# Patient Record
Sex: Female | Born: 1945 | Race: White | Hispanic: No | Smoking: Former smoker
Health system: Southern US, Community
[De-identification: ages and names within clinical notes are randomized; demographics above are authoritative.]

## PROBLEM LIST (undated history)

## (undated) DIAGNOSIS — L02619 Cutaneous abscess of unspecified foot: Secondary | ICD-10-CM

## (undated) DIAGNOSIS — L03039 Cellulitis of unspecified toe: Secondary | ICD-10-CM

## (undated) DIAGNOSIS — N200 Calculus of kidney: Secondary | ICD-10-CM

## (undated) HISTORY — PX: HEMORRHOID SURGERY: SHX153

## (undated) HISTORY — PX: TUBAL LIGATION: SHX77

## (undated) HISTORY — PX: CHOLECYSTECTOMY: SHX55

## (undated) HISTORY — PX: APPENDECTOMY: SHX54

---

## 2017-10-31 ENCOUNTER — Emergency Department (HOSPITAL_COMMUNITY)
Admission: EM | Admit: 2017-10-31 | Discharge: 2017-10-31 | Discharge status: Against Medical Advice | Payer: Self-pay | Attending: Emergency Medicine | Admitting: Emergency Medicine

## 2017-10-31 ENCOUNTER — Encounter (HOSPITAL_COMMUNITY): Payer: Self-pay | Admitting: *Deleted

## 2017-10-31 DIAGNOSIS — F17210 Nicotine dependence, cigarettes, uncomplicated: Secondary | ICD-10-CM | POA: Insufficient documentation

## 2017-10-31 DIAGNOSIS — Z87442 Personal history of urinary calculi: Secondary | ICD-10-CM | POA: Insufficient documentation

## 2017-10-31 DIAGNOSIS — Z5321 Procedure and treatment not carried out due to patient leaving prior to being seen by health care provider: Secondary | ICD-10-CM | POA: Insufficient documentation

## 2017-10-31 DIAGNOSIS — R109 Unspecified abdominal pain: Secondary | ICD-10-CM | POA: Insufficient documentation

## 2017-10-31 HISTORY — DX: Calculus of kidney: N20.0

## 2017-10-31 LAB — BASIC METABOLIC PANEL
ANION GAP: 10 (ref 5–15)
BUN: 11 mg/dL (ref 8–23)
CALCIUM: 9.8 mg/dL (ref 8.9–10.3)
CO2: 28 mmol/L (ref 22–32)
Chloride: 103 mmol/L (ref 98–111)
Creatinine, Ser: 0.76 mg/dL (ref 0.44–1.00)
GFR calc Af Amer: >60 mL/min (ref >60)
Glucose, Bld: 78 mg/dL (ref 70–99)
POTASSIUM: 4.6 mmol/L (ref 3.5–5.1)
SODIUM: 141 mmol/L (ref 135–145)

## 2017-10-31 LAB — URINALYSIS, ROUTINE W REFLEX MICROSCOPIC
Bilirubin Urine: NEGATIVE
GLUCOSE, UA: NEGATIVE mg/dL
Ketones, ur: NEGATIVE mg/dL
Nitrite: NEGATIVE
PH: 7 (ref 5.0–8.0)
Protein, ur: NEGATIVE mg/dL

## 2017-10-31 LAB — URINALYSIS, MICROSCOPIC (REFLEX)

## 2017-10-31 LAB — CBC WITH DIFFERENTIAL/PLATELET
Abs Immature Granulocytes: 0 10*3/uL (ref 0.0–0.1)
BASOS ABS: 0.1 10*3/uL (ref 0.0–0.1)
BASOS PCT: 1 %
Eosinophils Absolute: 0.1 10*3/uL (ref 0.0–0.7)
Eosinophils Relative: 1 %
HCT: 46.3 % — ABNORMAL HIGH (ref 36.0–46.0)
HEMOGLOBIN: 14.2 g/dL (ref 12.0–15.0)
Immature Granulocytes: 0 %
LYMPHS PCT: 30 %
Lymphs Abs: 2.5 10*3/uL (ref 0.7–4.0)
MCH: 29.2 pg (ref 26.0–34.0)
MCHC: 30.7 g/dL (ref 30.0–36.0)
MCV: 95.3 fL (ref 78.0–100.0)
Monocytes Absolute: 0.7 10*3/uL (ref 0.1–1.0)
Monocytes Relative: 8 %
NEUTROS ABS: 5 10*3/uL (ref 1.7–7.7)
Neutrophils Relative %: 60 %
PLATELETS: 249 10*3/uL (ref 150–400)
RBC: 4.86 MIL/uL (ref 3.87–5.11)
RDW: 13.2 % (ref 11.5–15.5)
WBC: 8.4 10*3/uL (ref 4.0–10.5)

## 2017-10-31 MED ORDER — SODIUM CHLORIDE 0.9 % IV BOLUS: 500.0000 mL | Freq: Once | INTRAVENOUS | Status: DC

## 2017-10-31 MED ORDER — KETOROLAC TROMETHAMINE 15 MG/ML IJ SOLN: 15.0000 mg | Freq: Once | INTRAMUSCULAR | Status: DC

## 2017-10-31 NOTE — ED Notes (Signed)
Pt requesting to leave. Apolinar JunesBrandon, [place], at the bedside discussing risks of leaving AMA. Pt voiced understanding.

## 2017-10-31 NOTE — ED Provider Notes (Signed)
[facility] EMERGENCY DEPARTMENT Provider Note   CSN: [id number] Arrival date & time: 10/31/17  1101     History   Chief Complaint Chief Complaint  Patient presents with  . Flank Pain    HPI Kristin Morales is a 72 y.o. female with self-reported history of kidney stones presenting for right flank pain today.  Patient states that her pain began on Saturday, describes it as a throbbing constant pain that has been gradually improving over the past 5 days.  Patient states for the first 4 days her pain was 10/10 in severity and she did not seek medical care.  Patient states that upon arrival to the emergency department her pain was 8/10 in severity.  Finally after providing urine sample patient states that her pain has greatly decreased to 6/10 in severity.  At time of my evaluation patient states that she does not wish to have blood work or imaging done, she is requesting pain medicine to "take the edge off ".  Patient denies history of fever, hematuria, dysuria, recent injury/fall.  Patient states that she feels well aside from moderate right flank pain at this time.  Patient denies abdominal pain or chest pain or headache.  I discussed benefits of blood work and imaging at length the patient, she still is refusing only requesting pain medicine at this time.  HPI  Past Medical History:  Diagnosis Date  . Kidney stones     There are no active problems to display for this patient.   Past Surgical History:  Procedure Laterality Date  . APPENDECTOMY    . CHOLECYSTECTOMY    . HEMORRHOID SURGERY    . TUBAL LIGATION       OB History   None      Home Medications    Prior to Admission medications   Not on File    Family History No family history on file.  Social History Social History   Tobacco Use  . Smoking status: Current Every Day Smoker  . Smokeless tobacco: Never Used  Substance Use Topics  . Alcohol use: Never    Frequency: Never  . Drug  use: Never     Allergies   Patient has no known allergies.   Review of Systems Review of Systems  Constitutional: Negative.  Negative for chills, fatigue and fever.  HENT: Negative.  Negative for rhinorrhea and sore throat.   Eyes: Negative.  Negative for visual disturbance.  Respiratory: Negative.  Negative for cough and shortness of breath.   Cardiovascular: Negative.  Negative for chest pain.  Gastrointestinal: Negative.  Negative for abdominal pain, blood in stool, diarrhea, nausea and vomiting.  Genitourinary: Positive for flank pain. Negative for dysuria and hematuria.  Musculoskeletal: Negative for arthralgias and myalgias.  Skin: Negative.  Negative for rash.  Neurological: Negative.  Negative for dizziness, weakness and headaches.     Physical Exam Updated Vital Signs BP 123/90 (BP Location: Right Arm)   Pulse 87   Temp 98.8 F (37.1 C) (Oral)   Resp 18   Ht 5\' 6"  (1.676 m)   Wt 70.3 kg   SpO2 98%   BMI 25.02 kg/m   Physical Exam  Constitutional: She is oriented to person, place, and time. She appears well-developed and well-nourished. No distress.  HENT:  Head: Normocephalic and atraumatic.  Right Ear: External ear normal.  Left Ear: External ear normal.  Nose: Nose normal.  Eyes: Pupils are equal, round, and reactive to light. EOM are normal.  Neck: Trachea normal and normal range of motion. No tracheal deviation present.  Cardiovascular: Normal rate, regular rhythm, normal heart sounds and intact distal pulses.  Pulses:      Dorsalis pedis pulses are 2+ on the right side, and 2+ on the left side.       Posterior tibial pulses are 2+ on the right side, and 2+ on the left side.  Pulmonary/Chest: Effort normal and breath sounds normal. No respiratory distress.  Abdominal: Soft. Bowel sounds are normal. There is no tenderness. There is CVA tenderness. There is no rigidity, no rebound, no guarding, no tenderness at McBurney's point and negative Murphy's sign.    Multiple well-healed surgical scars present.  Musculoskeletal: Normal range of motion.       Cervical back: Normal.       Thoracic back: Normal.       Lumbar back: Normal.       Back:  Area of patient's flank pain indicated on chart.  Neurological: She is alert and oriented to person, place, and time. GCS eye subscore is 4. GCS verbal subscore is 5. GCS motor subscore is 6.  Speech is clear and goal oriented, follows commands Major Cranial nerves without deficit, no facial droop Moves extremities without ataxia, coordination intact Normal gait  Skin: Skin is warm and dry.  Psychiatric: She has a normal mood and affect. Her behavior is normal.   ED Treatments / Results  Labs (all labs ordered are listed, but only abnormal results are displayed) Labs Reviewed  URINALYSIS, ROUTINE W REFLEX MICROSCOPIC - Abnormal; Notable for the following components:      Result Value   Specific Gravity, Urine <1.005 (*)    Hgb urine dipstick TRACE (*)    Leukocytes, UA TRACE (*)    All other components within normal limits  URINALYSIS, MICROSCOPIC (REFLEX) - Abnormal; Notable for the following components:   Bacteria, UA RARE (*)    All other components within normal limits  BASIC METABOLIC PANEL  CBC WITH DIFFERENTIAL/PLATELET    EKG None  Radiology No results found.  Procedures Procedures (including critical care time)  Medications Ordered in ED Medications  ketorolac (TORADOL) 15 MG/ML injection 15 mg (has no administration in time range)  sodium chloride 0.9 % bolus 500 mL (has no administration in time range)     Initial Impression / Assessment and Plan / ED Course  I have reviewed the triage vital signs and the nursing notes.  Pertinent labs & imaging results that were available during my care of the patient were reviewed by me and considered in my medical decision making (see chart for details).  Clinical Course as of Oct 31 1320  Wed Oct 31, 2017  1310 Patient attempting  to leave department.  States that she is feeling better, does not wish to receive full work-up today including blood work or imaging.   [BM]  1317 I have discussed the risks of leaving AGAINST MEDICAL ADVICE at length with the patient including increase in pain, infection, worsening of symptoms, disability and death.  Patient states understanding of risks and still wishes to leave AGAINST MEDICAL ADVICE at this time.  Patient is alert and oriented x3, full mental capacity to make her own medical decisions.  Patient has been encouraged to return to the emergency department at any time for further evaluation and treatment.  Patient has left at this time AGAINST MEDICAL ADVICE.   [BM]    Clinical Course User Index [BM] Bill Salinas,  PA-C   Blood work, fluids, imaging not performed today per patient. Urinalysis shows trace hemoglobin and trace of leukocytes.  Patient informed of results and still refuses further work-up.  I have discussed the risks of leaving AGAINST MEDICAL ADVICE at length with the patient including pain, infection, worsening of symptoms, disability and death.  Patient states understanding of risks and still wishes to leave AGAINST MEDICAL ADVICE at this time.  Patient is alert and oriented x3 and has full mental capacity and the ability to make her own medical decisions.  Patient has been encouraged to return to the emergency department at any time for further evaluation and treatment.  Patient states understanding.  Patient has left at this time AGAINST MEDICAL ADVICE.  Note: Portions of this report may have been transcribed using voice recognition software. Every effort was made to ensure accuracy; however, inadvertent computerized transcription errors may still be present.  Final Clinical Impressions(s) / ED Diagnoses   Final diagnoses:  Flank pain    ED Discharge Orders    None       Elizabeth Palau 10/31/17 1324    Mancel Bale, MD 11/06/17  [phone]

## 2017-10-31 NOTE — ED Triage Notes (Signed)
Pt with hx of kidney stones which occur about once or twice a year is here for right flank pain that has been going on since Saturday.  Pt denies any urinary symptoms.  Pt is alert and oriented.  8/10 pain

## 2017-11-01 ENCOUNTER — Other Ambulatory Visit: Payer: Self-pay

## 2017-11-01 ENCOUNTER — Encounter (HOSPITAL_COMMUNITY): Payer: Self-pay | Admitting: Emergency Medicine

## 2017-11-01 ENCOUNTER — Emergency Department (HOSPITAL_COMMUNITY)
Admission: EM | Admit: 2017-11-01 | Discharge: 2017-11-02 | Disposition: A | Discharge status: Home / Self Care | Payer: Self-pay | Attending: Emergency Medicine | Admitting: Emergency Medicine

## 2017-11-01 DIAGNOSIS — F17200 Nicotine dependence, unspecified, uncomplicated: Secondary | ICD-10-CM | POA: Insufficient documentation

## 2017-11-01 DIAGNOSIS — M549 Dorsalgia, unspecified: Secondary | ICD-10-CM | POA: Insufficient documentation

## 2017-11-01 DIAGNOSIS — N390 Urinary tract infection, site not specified: Secondary | ICD-10-CM | POA: Insufficient documentation

## 2017-11-01 LAB — BASIC METABOLIC PANEL
ANION GAP: 8 (ref 5–15)
BUN: 13 mg/dL (ref 8–23)
CALCIUM: 9.1 mg/dL (ref 8.9–10.3)
CO2: 27 mmol/L (ref 22–32)
Chloride: 104 mmol/L (ref 98–111)
Creatinine, Ser: 0.78 mg/dL (ref 0.44–1.00)
Glucose, Bld: 90 mg/dL (ref 70–99)
POTASSIUM: 4 mmol/L (ref 3.5–5.1)
SODIUM: 139 mmol/L (ref 135–145)

## 2017-11-01 LAB — CBC
HCT: 43.6 % (ref 36.0–46.0)
Hemoglobin: 13.5 g/dL (ref 12.0–15.0)
MCH: 29.7 pg (ref 26.0–34.0)
MCHC: 31 g/dL (ref 30.0–36.0)
MCV: 96 fL (ref 78.0–100.0)
Platelets: 242 10*3/uL (ref 150–400)
RBC: 4.54 MIL/uL (ref 3.87–5.11)
RDW: 13.4 % (ref 11.5–15.5)
WBC: 8.5 10*3/uL (ref 4.0–10.5)

## 2017-11-01 LAB — URINALYSIS, ROUTINE W REFLEX MICROSCOPIC
Bilirubin Urine: NEGATIVE
Glucose, UA: NEGATIVE mg/dL
KETONES UR: NEGATIVE mg/dL
Nitrite: NEGATIVE
PROTEIN: NEGATIVE mg/dL
Specific Gravity, Urine: 1.013 (ref 1.005–1.030)
pH: 5 (ref 5.0–8.0)

## 2017-11-01 NOTE — ED Notes (Signed)
Pt called to come back to triage. No response.

## 2017-11-01 NOTE — ED Triage Notes (Signed)
Pt presents with continued R flank pain and dysuria and dribbling; pt states she was here the other day for same and left d/t wait and didn't have insurance and wasn't sure if she could afford a CT renal scan but found out she has a charity that can help

## 2017-11-02 ENCOUNTER — Emergency Department (HOSPITAL_COMMUNITY): Payer: Self-pay

## 2017-11-02 MED ORDER — ONDANSETRON HCL 4 MG/2ML IJ SOLN
4.0000 mg | Freq: Once | INTRAMUSCULAR | Status: AC
  Administered 2017-11-02: 4 mg via INTRAVENOUS
  Filled 2017-11-02: qty 2

## 2017-11-02 MED ORDER — FENTANYL CITRATE (PF) 100 MCG/2ML IJ SOLN
50.0000 ug | Freq: Once | INTRAMUSCULAR | Status: AC
  Administered 2017-11-02: 50 ug via INTRAVENOUS
  Filled 2017-11-02: qty 2

## 2017-11-02 MED ORDER — SODIUM CHLORIDE 0.9 % IV SOLN
1.0000 g | Freq: Once | INTRAVENOUS | Status: AC
  Administered 2017-11-02: 1 g via INTRAVENOUS
  Filled 2017-11-02: qty 10

## 2017-11-02 MED ORDER — CEPHALEXIN 500 MG PO CAPS: 500.0000 mg | ORAL_CAPSULE | Freq: Two times a day (BID) | ORAL | 0 refills | Status: DC

## 2017-11-02 MED ORDER — HYDROCODONE-ACETAMINOPHEN 5-325 MG PO TABS: 1.0000 | ORAL_TABLET | ORAL | 0 refills | Status: DC | PRN

## 2017-11-02 NOTE — ED Provider Notes (Signed)
TIME SEEN: 3:35 AM  CHIEF COMPLAINT: Right flank pain  HPI: Patient is a 72 year old female with history of kidney stones who presents to the emergency department with right flank pain.  Having some difficulty with urinating, urinary frequency and urgency but no dysuria or hematuria.  Denies nausea, vomiting or diarrhea.  No injury to her back.  No abdominal pain.  Was seen here earlier today and it was recommended that she have lab work, CT imaging but she left AGAINST MEDICAL ADVICE.  States she is back because she has found a way to pay for her visit.  ROS: See HPI Constitutional: no fever  Eyes: no drainage  ENT: no runny nose   Cardiovascular:  no chest pain  Resp: no SOB  GI: no vomiting GU: no dysuria Integumentary: no rash  Allergy: no hives  Musculoskeletal: no leg swelling  Neurological: no slurred speech ROS otherwise negative  PAST MEDICAL HISTORY/PAST SURGICAL HISTORY:  Past Medical History:  Diagnosis Date  . Kidney stones     MEDICATIONS:  Prior to Admission medications   Not on File    ALLERGIES:  No Known Allergies  SOCIAL HISTORY:  Social History   Tobacco Use  . Smoking status: Current Every Day Smoker  . Smokeless tobacco: Never Used  Substance Use Topics  . Alcohol use: Never    Frequency: Never    FAMILY HISTORY: History reviewed. No pertinent family history.  EXAM: BP (!) 194/84   Pulse 87   Temp 97.9 F (36.6 C) (Oral)   Resp 20   SpO2 96%  CONSTITUTIONAL: Alert and oriented and responds appropriately to questions. Well-appearing; well-nourished HEAD: Normocephalic EYES: Conjunctivae clear, pupils appear equal, EOMI ENT: normal nose; moist mucous membranes NECK: Supple, no meningismus, no nuchal rigidity, no LAD  CARD: RRR; S1 and S2 appreciated; no murmurs, no clicks, no rubs, no gallops RESP: Normal chest excursion without splinting or tachypnea; breath sounds clear and equal bilaterally; no wheezes, no rhonchi, no rales, no  hypoxia or respiratory distress, speaking full sentences ABD/GI: Normal bowel sounds; non-distended; soft, non-tender, no rebound, no guarding, no peritoneal signs, no hepatosplenomegaly BACK:  The back appears normal and is tender to the right lateral lumbar area, no midline spinal tenderness or step-off or deformity, there is no CVA tenderness EXT: Normal ROM in all joints; non-tender to palpation; no edema; normal capillary refill; no cyanosis, no calf tenderness or swelling    SKIN: Normal color for age and race; warm; no rash NEURO: Moves all extremities equally, normal sensation, normal gait PSYCH: The patient's mood and manner are appropriate. Grooming and personal hygiene are appropriate.  MEDICAL DECISION MAKING: Patient here with right-sided flank pain.  Urine does appear to be infected.  We will send urine culture and give IV antibiotics.  Will obtain CT scan to evaluate for possible infected stone.  No midline spinal tenderness, neurologic deficits, injury to her back to suggest fracture, osteomyelitis or discitis, epidural abscess or hematoma, spinal stenosis or cauda equina.  ED PROGRESS: CT scan shows no acute abnormality.  Incidental finding of a right lower lobe pulmonary nodule found.  Recommended outpatient follow-up.  Pain is improved with fentanyl.  Will treat for UTI with Keflex and discharged with brief course of pain medication as this may be musculoskeletal.  No sign of pyelonephritis seen on CT imaging.  Patient comfortable with this plan.   At this time, I do not feel there is any life-threatening condition present. I have reviewed and discussed  all results (EKG, imaging, lab, urine as appropriate) and exam findings with patient/family. I have reviewed nursing notes and appropriate previous records.  I feel the patient is safe to be discharged home without further emergent workup and can continue workup as an outpatient as needed. Discussed usual and customary return  precautions. Patient/family verbalize understanding and are comfortable with this plan.  Outpatient follow-up has been provided if needed. All questions have been answered.      Ward, Layla Maw, DO 11/02/17 [phone]

## 2017-11-02 NOTE — ED Notes (Signed)
Patient transported to CT 

## 2017-11-02 NOTE — Discharge Instructions (Signed)
To find a primary care or specialty doctor please call [phone] or [phone] to access "[facility] Find a Doctor Service." ° °You may also go on the [facility] website at [email or web address] ° °There are also multiple [facility], [facility] and [facility] practices throughout [place] that are frequently accepting new patients. You may find a clinic that is close to your home and contact them. ° °[facility] -  °[address] °[place] [address] °[phone] ° ° °[facility] -  °[address] °[place] [address] °[phone] ° ° °[facility] - °[address]  °[place] [address] °[phone] ° ° °

## 2017-11-02 NOTE — ED Notes (Signed)
Wasted fentanyl in [facility] med room in sharps container with Erie Noe, RN as witness.

## 2017-11-03 LAB — URINE CULTURE: Culture: <10000 — AB

## 2018-04-07 DIAGNOSIS — L02619 Cutaneous abscess of unspecified foot: Secondary | ICD-10-CM

## 2018-04-07 DIAGNOSIS — L03039 Cellulitis of unspecified toe: Secondary | ICD-10-CM

## 2018-04-07 HISTORY — DX: Cellulitis of unspecified toe: L03.039

## 2018-04-07 HISTORY — DX: Cutaneous abscess of unspecified foot: L02.619

## 2018-04-15 ENCOUNTER — Encounter (HOSPITAL_COMMUNITY): Payer: Self-pay

## 2018-04-15 ENCOUNTER — Emergency Department (HOSPITAL_COMMUNITY)
Admission: EM | Admit: 2018-04-15 | Discharge: 2018-04-16 | Disposition: A | Discharge status: Home / Self Care | Payer: Self-pay | Attending: Emergency Medicine | Admitting: Emergency Medicine

## 2018-04-15 ENCOUNTER — Emergency Department (HOSPITAL_COMMUNITY): Payer: Self-pay

## 2018-04-15 ENCOUNTER — Other Ambulatory Visit: Payer: Self-pay

## 2018-04-15 DIAGNOSIS — S92532B Displaced fracture of distal phalanx of left lesser toe(s), initial encounter for open fracture: Secondary | ICD-10-CM | POA: Insufficient documentation

## 2018-04-15 DIAGNOSIS — Z23 Encounter for immunization: Secondary | ICD-10-CM | POA: Insufficient documentation

## 2018-04-15 DIAGNOSIS — Y999 Unspecified external cause status: Secondary | ICD-10-CM | POA: Insufficient documentation

## 2018-04-15 DIAGNOSIS — Y929 Unspecified place or not applicable: Secondary | ICD-10-CM | POA: Insufficient documentation

## 2018-04-15 DIAGNOSIS — I10 Essential (primary) hypertension: Secondary | ICD-10-CM | POA: Insufficient documentation

## 2018-04-15 DIAGNOSIS — Y93E6 Activity, residential relocation: Secondary | ICD-10-CM | POA: Insufficient documentation

## 2018-04-15 DIAGNOSIS — W208XXA Other cause of strike by thrown, projected or falling object, initial encounter: Secondary | ICD-10-CM | POA: Insufficient documentation

## 2018-04-15 DIAGNOSIS — F17210 Nicotine dependence, cigarettes, uncomplicated: Secondary | ICD-10-CM | POA: Insufficient documentation

## 2018-04-15 MED ORDER — LIDOCAINE-EPINEPHRINE-TETRACAINE (LET) SOLUTION
3.0000 mL | Freq: Once | NASAL | Status: AC
  Administered 2018-04-15: 3 mL via TOPICAL
  Filled 2018-04-15: qty 3

## 2018-04-15 MED ORDER — TETANUS-DIPHTH-ACELL PERTUSSIS 5-2.5-18.5 LF-MCG/0.5 IM SUSP
0.5000 mL | Freq: Once | INTRAMUSCULAR | Status: AC
  Administered 2018-04-15: 0.5 mL via INTRAMUSCULAR
  Filled 2018-04-15: qty 0.5

## 2018-04-15 MED ORDER — HYDROCODONE-ACETAMINOPHEN 5-325 MG PO TABS
1.0000 | ORAL_TABLET | Freq: Once | ORAL | Status: AC
  Administered 2018-04-16: 1 via ORAL
  Filled 2018-04-15: qty 1

## 2018-04-15 MED ORDER — LIDOCAINE HCL 2 % IJ SOLN
10.0000 mL | Freq: Once | INTRAMUSCULAR | Status: AC
  Administered 2018-04-15: 200 mg via INTRADERMAL
  Filled 2018-04-15: qty 20

## 2018-04-15 MED ORDER — CEPHALEXIN 250 MG PO CAPS
500.0000 mg | ORAL_CAPSULE | Freq: Once | ORAL | Status: AC
  Administered 2018-04-16: 500 mg via ORAL
  Filled 2018-04-15: qty 2

## 2018-04-15 NOTE — ED Triage Notes (Signed)
Pt arrives POV for eval of L foot laceration sustained when a box of junk opened and dropped on her foot. Large lac noted to L 4th toe. Bleeding controlled in triage, gauze applied.

## 2018-04-15 NOTE — ED Provider Notes (Signed)
[facility] EMERGENCY DEPARTMENT Provider Note   CSN: [id number] Arrival date & time: 04/15/18  2033    History   Chief Complaint Chief Complaint  Patient presents with  . Extremity Laceration    HPI Kristin Morales is a 73 y.o. female presents for evaluation of acute onset, persistent laceration to the left fourth toe sustained at around 8 PM.  Patient reports that she was helping her daughter move and picked up a box containing glass and sharp knives.  She reports that the bottom of the box came undone and an unknown object fell on her foot resulting in a laceration to the medial aspect of the left toe.  She reports a constant burning pain that worsens with palpation.  She also notes some numbness to the distal tip of the toe.  She is not on any anticoagulation.  She denies any lightheadedness, shortness of breath, chest pain, or headaches.  She reports that she does not have a history of high blood pressure but becomes nervous when her blood pressure is checked in a hospital setting.  She is unsure if her tetanus is up-to-date.     The history is provided by the patient.    Past Medical History:  Diagnosis Date  . Kidney stones     There are no active problems to display for this patient.   Past Surgical History:  Procedure Laterality Date  . APPENDECTOMY    . CHOLECYSTECTOMY    . HEMORRHOID SURGERY    . TUBAL LIGATION       OB History   No obstetric history on file.      Home Medications    Prior to Admission medications   Medication Sig Start Date End Date Taking? Authorizing Provider  cephALEXin (KEFLEX) 500 MG capsule Take 1 capsule (500 mg total) by mouth 4 (four) times daily for 5 days. 04/16/18 04/21/18  Michela Pitcher A, PA-C  HYDROcodone-acetaminophen (NORCO/VICODIN) 5-325 MG tablet Take 1 tablet by mouth every 6 (six) hours as needed for severe pain. 04/15/18   Jeanie Sewer, PA-C    Family History History reviewed. No pertinent family  history.  Social History Social History   Tobacco Use  . Smoking status: Current Every Day Smoker  . Smokeless tobacco: Never Used  Substance Use Topics  . Alcohol use: Never    Frequency: Never  . Drug use: Never     Allergies   Patient has no known allergies.   Review of Systems Review of Systems  Constitutional: Negative for fever.  Respiratory: Negative for shortness of breath.   Cardiovascular: Negative for chest pain.  Gastrointestinal: Negative for abdominal pain, nausea and vomiting.  Skin: Positive for wound.  Neurological: Positive for numbness. Negative for headaches.     Physical Exam Updated Vital Signs BP (!) 194/86 (BP Location: Right Arm)   Pulse 85   Temp 97.9 F (36.6 C) (Oral)   Resp 16   Ht 5\' 6"  (1.676 m)   Wt 63.5 kg   SpO2 94%   BMI 22.60 kg/m   Physical Exam Vitals signs and nursing note reviewed.  Constitutional:      General: She is not in acute distress.    Appearance: She is well-developed.  HENT:     Head: Normocephalic and atraumatic.  Eyes:     General:        Right eye: No discharge.        Left eye: No discharge.  Conjunctiva/sclera: Conjunctivae normal.  Neck:     Vascular: No JVD.     Trachea: No tracheal deviation.  Cardiovascular:     Rate and Rhythm: Normal rate.     Pulses: Normal pulses.     Comments: 2+ DP/PT pulses bilaterally Pulmonary:     Effort: Pulmonary effort is normal.  Abdominal:     General: There is no distension.  Musculoskeletal:     Comments: 4 cm semilunar laceration noted to the medial aspect of the left fourth toe extending from the lateral nail fold down to the plantar aspect of the toe.  Bleeding is controlled.  Limited active range of motion of the toe but is able to flex and extend against resistance.    Skin:    General: Skin is warm and dry.     Findings: No erythema.  Neurological:     Mental Status: She is alert.     Comments: Fluent speech, no facial droop, slightly altered  sensation to soft touch of the distal aspect of the left fourth toe  Psychiatric:        Behavior: Behavior normal.          ED Treatments / Results  Labs (all labs ordered are listed, but only abnormal results are displayed) Labs Reviewed - No data to display  EKG None  Radiology Dg Toe 4th Left  Result Date: 04/15/2018 CLINICAL DATA:  Left fourth toe laceration after injury today. EXAM: LEFT FOURTH TOE COMPARISON:  None. FINDINGS: Mildly displaced fracture is seen involving the distal tuft of the fourth distal phalanx. Joint spaces are unremarkable. No soft tissue abnormality is noted. No radiopaque foreign body is noted. IMPRESSION: Mildly displaced distal tuft fracture of fourth distal phalanx. Electronically Signed   By: Lupita Raider, M.D.   On: 04/15/2018 21:54    Procedures .Marland KitchenLaceration Repair Date/Time: 04/15/2018 11:56 PM Performed by: Jeanie Sewer, PA-C Authorized by: Jeanie Sewer, PA-C   Consent:    Consent obtained:  Verbal   Consent given by:  Patient   Risks discussed:  Infection, need for additional repair, pain, poor cosmetic result and poor wound healing   Alternatives discussed:  No treatment and delayed treatment Universal protocol:    Procedure explained and questions answered to patient or proxy's satisfaction: yes     Relevant documents present and verified: yes     Test results available and properly labeled: yes     Imaging studies available: yes     Required blood products, implants, devices, and special equipment available: yes     Site/side marked: yes     Immediately prior to procedure, a time out was called: yes     Patient identity confirmed:  Verbally with patient Anesthesia (see MAR for exact dosages):    Anesthesia method:  Local infiltration and topical application   Topical anesthetic:  LET   Local anesthetic:  Lidocaine 2% w/o epi Laceration details:    Location:  Toe   Toe location:  L fourth toe   Length (cm):  4   Depth  (mm):  4 Repair type:    Repair type:  Intermediate Pre-procedure details:    Preparation:  Patient was prepped and draped in usual sterile fashion and imaging obtained to evaluate for foreign bodies Exploration:    Hemostasis achieved with:  Direct pressure and LET   Wound exploration: wound explored through full range of motion and entire depth of wound probed and visualized  Wound extent: areolar tissue violated and underlying fracture     Contaminated: yes   Treatment:    Area cleansed with:  Saline and Betadine   Amount of cleaning:  Extensive   Irrigation solution:  Sterile saline   Irrigation method:  Pressure wash   Visualized foreign bodies/material removed: no   Skin repair:    Repair method:  Sutures   Suture size:  4-0   Wound skin closure material used: vicryl.   Suture technique:  Simple interrupted   Number of sutures:  9 Approximation:    Approximation:  Close Post-procedure details:    Dressing:  Non-adherent dressing   Patient tolerance of procedure:  Tolerated well, no immediate complications .Marland KitchenLaceration Repair Date/Time: 04/15/2018 11:57 PM Performed by: Jeanie Sewer, PA-C Authorized by: Jeanie Sewer, PA-C   Consent:    Consent obtained:  Verbal   Consent given by:  Patient   Risks discussed:  Infection, need for additional repair, pain, poor cosmetic result and poor wound healing   Alternatives discussed:  No treatment and delayed treatment Universal protocol:    Procedure explained and questions answered to patient or proxy's satisfaction: yes     Relevant documents present and verified: yes     Test results available and properly labeled: yes     Imaging studies available: yes     Required blood products, implants, devices, and special equipment available: yes     Site/side marked: yes     Immediately prior to procedure, a time out was called: yes     Patient identity confirmed:  Verbally with patient Anesthesia (see MAR for exact dosages):     Anesthesia method:  Local infiltration   Local anesthetic:  Lidocaine 2% w/o epi Laceration details:    Location:  Toe   Toe location:  L fourth toe   Length (cm):  0.4   Depth (mm):  2 Repair type:    Repair type:  Simple Pre-procedure details:    Preparation:  Patient was prepped and draped in usual sterile fashion and imaging obtained to evaluate for foreign bodies Treatment:    Area cleansed with:  Betadine and saline   Amount of cleaning:  Extensive   Irrigation solution:  Sterile saline   Irrigation method:  Pressure wash   Visualized foreign bodies/material removed: no   Skin repair:    Repair method:  Sutures   Suture size:  4-0   Wound skin closure material used: vicryl.   Suture technique:  Simple interrupted   Number of sutures:  1 Approximation:    Approximation:  Close Post-procedure details:    Dressing:  Non-adherent dressing   (including critical care time)  Medications Ordered in ED Medications  lidocaine-EPINEPHrine-tetracaine (LET) solution (3 mLs Topical Given 04/15/18 2232)  lidocaine (XYLOCAINE) 2 % (with pres) injection 200 mg (200 mg Intradermal Given 04/15/18 2234)  Tdap (BOOSTRIX) injection 0.5 mL (0.5 mLs Intramuscular Given 04/15/18 2232)  HYDROcodone-acetaminophen (NORCO/VICODIN) 5-325 MG per tablet 1 tablet (1 tablet Oral Given 04/16/18 0006)  cephALEXin (KEFLEX) capsule 500 mg (500 mg Oral Given 04/16/18 0006)     Initial Impression / Assessment and Plan / ED Course  I have reviewed the triage vital signs and the nursing notes.  Pertinent labs & imaging results that were available during my care of the patient were reviewed by me and considered in my medical decision making (see chart for details).        Patient presenting for evaluation of left  fourth toe laceration.  She is afebrile, initially mildly tachycardic with resolution on reevaluation and quite hypertensive with improvement on reevaluation.  She reports that she is sometimes  hypertensive secondary to pain.  She does not have a PCP here and recently moved from [place].  She is in the process of establishing care with a PCP.  She has no history of hypertension.  I instructed her to monitor her blood pressures closely at home and to follow-up with a primary care physician right away if her hypertension persist.  She is otherwise asymptomatic and there is no indication of any endorgan damage.  With regards to her toe laceration, bleeding is controlled.  She is neurovascularly intact.  Radiographs show underlying comminuted distal tuft fracture. Pressure irrigation performed. Wound explored and base of wound visualized in a bloodless field without evidence of foreign body.  Laceration occurred < 8 hours prior to repair which was well tolerated.  We discussed the utility, risks and benefits of absorbable versus nonabsorbable sutures and she would like to proceed with absorbable sutures though she understands that there is a possibility of higher risk of infection and scarring. Tdap updated.  Pt has  no comorbidities to effect normal wound healing. Pt discharged with antibiotics.  Discussed suture home care with patient and answered questions.  Discussed strict ED return precautions.  Patient and her daughter verbalized understanding of and agreement with plan and patient stable for discharge home at this time.  Discussed with Dr. Daun Peacock who agrees with assessment and plan at this time.   Final Clinical Impressions(s) / ED Diagnoses   Final diagnoses:  Open displaced fracture of distal phalanx of lesser toe of left foot, initial encounter  Hypertension, unspecified type    ED Discharge Orders         Ordered    cephALEXin (KEFLEX) 500 MG capsule  4 times daily     04/16/18 0000    HYDROcodone-acetaminophen (NORCO/VICODIN) 5-325 MG tablet  Every 6 hours PRN     04/16/18 0000           Jeanie Sewer, PA-C 04/16/18 0036    Palumbo, April, MD 04/16/18 0126

## 2018-04-16 MED ORDER — CEPHALEXIN 500 MG PO CAPS: 500.0000 mg | ORAL_CAPSULE | Freq: Four times a day (QID) | ORAL | 0 refills | Status: AC

## 2018-04-16 MED ORDER — HYDROCODONE-ACETAMINOPHEN 5-325 MG PO TABS: 1.0000 | ORAL_TABLET | Freq: Four times a day (QID) | ORAL | 0 refills | Status: DC | PRN

## 2018-04-16 NOTE — ED Notes (Signed)
E-signature not available, verbalized understanding of DC instructions and prescriptions.  

## 2018-04-16 NOTE — Discharge Instructions (Signed)
1. Medications: Alternate 400-600 mg of ibuprofen and [phone] mg of Tylenol every 3 hours as needed for pain. Do not exceed 4000 mg of Tylenol daily.  Take ibuprofen with food to avoid upset stomach issues.  You can take hydrocodone as needed for severe pain but do not drive, drink alcohol, operate heavy machinery, or make important decisions while taking this medicine as it may make you drowsy.  You may also cause constipation, so you may want to take a stool softener with this.  Be aware this medication also contains Tylenol. 2. Treatment: ice for swelling, keep wound clean with warm soap and water and keep bandage dry, do not submerge in water for 24 hours 3. Follow Up: Follow-up with orthopedics for reevaluation of your toe fracture if you are having any ongoing issues.  Return to the emergency department sooner if any concerning signs or symptoms develop such as high fevers, redness, drainage of pus from the wound, or swelling.  If your blood pressure (BP) was elevated on multiple readings during this visit above 130 for the top number or above 80 for the bottom number, please have this repeated by your primary care provider within one month. You can also check your blood pressure when you are out at a pharmacy or grocery store. Many have machines that will check your blood pressure.  If your blood pressure remains elevated, please follow-up with your PCP.    WOUND CARE  Keep area clean and dry for 24 hours. Do not remove bandage, if applied.  After 24 hours, remove bandage and wash wound gently with mild soap and warm water. Reapply a new bandage after cleaning wound, if directed.   Continue daily cleansing with soap and water until stitches/staples are removed.  Do not apply any ointments or creams to the wound while stitches/staples are in place, as this may cause delayed healing. Return if you experience any of the following signs of infection: Swelling, redness, pus drainage, streaking, fever  >101.0 F  Return if you experience excessive bleeding that does not stop after 15-20 minutes of constant, firm pressure.

## 2018-05-04 ENCOUNTER — Encounter (HOSPITAL_COMMUNITY): Payer: Self-pay | Admitting: Emergency Medicine

## 2018-05-04 ENCOUNTER — Inpatient Hospital Stay (HOSPITAL_COMMUNITY)
Admission: EM | Admit: 2018-05-04 | Discharge: 2018-05-07 | DRG: 300 | Disposition: A | Discharge status: Home / Self Care | Payer: Medicare Other | Attending: Internal Medicine | Admitting: Internal Medicine

## 2018-05-04 ENCOUNTER — Emergency Department (HOSPITAL_COMMUNITY): Payer: Medicare Other

## 2018-05-04 ENCOUNTER — Other Ambulatory Visit: Payer: Self-pay

## 2018-05-04 DIAGNOSIS — X58XXXA Exposure to other specified factors, initial encounter: Secondary | ICD-10-CM

## 2018-05-04 DIAGNOSIS — Z79899 Other long term (current) drug therapy: Secondary | ICD-10-CM

## 2018-05-04 DIAGNOSIS — D62 Acute posthemorrhagic anemia: Secondary | ICD-10-CM | POA: Diagnosis present

## 2018-05-04 DIAGNOSIS — W208XXA Other cause of strike by thrown, projected or falling object, initial encounter: Secondary | ICD-10-CM | POA: Diagnosis present

## 2018-05-04 DIAGNOSIS — S92532A Displaced fracture of distal phalanx of left lesser toe(s), initial encounter for closed fracture: Secondary | ICD-10-CM

## 2018-05-04 DIAGNOSIS — F17210 Nicotine dependence, cigarettes, uncomplicated: Secondary | ICD-10-CM | POA: Diagnosis present

## 2018-05-04 DIAGNOSIS — S92502A Displaced unspecified fracture of left lesser toe(s), initial encounter for closed fracture: Secondary | ICD-10-CM | POA: Diagnosis present

## 2018-05-04 DIAGNOSIS — I10 Essential (primary) hypertension: Secondary | ICD-10-CM | POA: Diagnosis present

## 2018-05-04 DIAGNOSIS — L03032 Cellulitis of left toe: Secondary | ICD-10-CM | POA: Diagnosis not present

## 2018-05-04 DIAGNOSIS — L0390 Cellulitis, unspecified: Secondary | ICD-10-CM

## 2018-05-04 DIAGNOSIS — I96 Gangrene, not elsewhere classified: Secondary | ICD-10-CM | POA: Diagnosis not present

## 2018-05-04 DIAGNOSIS — S91115A Laceration without foreign body of left lesser toe(s) without damage to nail, initial encounter: Secondary | ICD-10-CM

## 2018-05-04 DIAGNOSIS — I160 Hypertensive urgency: Secondary | ICD-10-CM | POA: Diagnosis present

## 2018-05-04 DIAGNOSIS — T148XXA Other injury of unspecified body region, initial encounter: Secondary | ICD-10-CM

## 2018-05-04 DIAGNOSIS — L089 Local infection of the skin and subcutaneous tissue, unspecified: Secondary | ICD-10-CM

## 2018-05-04 HISTORY — DX: Cellulitis of unspecified toe: L03.039

## 2018-05-04 HISTORY — DX: Cutaneous abscess of unspecified foot: L02.619

## 2018-05-04 LAB — BASIC METABOLIC PANEL
Anion gap: 12 (ref 5–15)
BUN: 15 mg/dL (ref 8–23)
CHLORIDE: 102 mmol/L (ref 98–111)
CO2: 22 mmol/L (ref 22–32)
CREATININE: 0.7 mg/dL (ref 0.44–1.00)
Calcium: 9.3 mg/dL (ref 8.9–10.3)
GFR calc Af Amer: >60 mL/min (ref >60)
GFR calc non Af Amer: >60 mL/min (ref >60)
GLUCOSE: 101 mg/dL — AB (ref 70–99)
Potassium: 4.1 mmol/L (ref 3.5–5.1)
SODIUM: 136 mmol/L (ref 135–145)

## 2018-05-04 LAB — SEDIMENTATION RATE: Sed Rate: 39 mm/hr — ABNORMAL HIGH (ref 0–22)

## 2018-05-04 LAB — CBC WITH DIFFERENTIAL/PLATELET
Abs Immature Granulocytes: 0.02 10*3/uL (ref 0.00–0.07)
Basophils Absolute: 0.1 10*3/uL (ref 0.0–0.1)
Basophils Relative: 1 %
EOS PCT: 2 %
Eosinophils Absolute: 0.2 10*3/uL (ref 0.0–0.5)
HCT: 43.2 % (ref 36.0–46.0)
HEMOGLOBIN: 13.5 g/dL (ref 12.0–15.0)
Immature Granulocytes: 0 %
LYMPHS PCT: 28 %
Lymphs Abs: 2.2 10*3/uL (ref 0.7–4.0)
MCH: 28.9 pg (ref 26.0–34.0)
MCHC: 31.3 g/dL (ref 30.0–36.0)
MCV: 92.5 fL (ref 80.0–100.0)
MONO ABS: 0.5 10*3/uL (ref 0.1–1.0)
Monocytes Relative: 6 %
Neutro Abs: 5 10*3/uL (ref 1.7–7.7)
Neutrophils Relative %: 63 %
Platelets: 242 10*3/uL (ref 150–400)
RBC: 4.67 MIL/uL (ref 3.87–5.11)
RDW: 13.3 % (ref 11.5–15.5)
WBC: 7.9 10*3/uL (ref 4.0–10.5)
nRBC: 0 % (ref 0.0–0.2)

## 2018-05-04 LAB — C-REACTIVE PROTEIN: CRP: <0.8 mg/dL (ref <1.0)

## 2018-05-04 MED ORDER — AMLODIPINE BESYLATE 10 MG PO TABS
10.0000 mg | ORAL_TABLET | Freq: Every day | ORAL | Status: DC
  Administered 2018-05-04 – 2018-05-07 (×4): 10 mg via ORAL
  Filled 2018-05-04: qty 1
  Filled 2018-05-04: qty 2
  Filled 2018-05-04 (×2): qty 1

## 2018-05-04 MED ORDER — CLINDAMYCIN PHOSPHATE 900 MG/50ML IV SOLN
900.0000 mg | Freq: Once | INTRAVENOUS | Status: AC
  Administered 2018-05-04: 900 mg via INTRAVENOUS
  Filled 2018-05-04: qty 50

## 2018-05-04 MED ORDER — LACTATED RINGERS IV SOLN: INTRAVENOUS | Status: DC

## 2018-05-04 MED ORDER — HYDROCODONE-ACETAMINOPHEN 5-325 MG PO TABS
1.0000 | ORAL_TABLET | ORAL | Status: DC | PRN
  Administered 2018-05-05 – 2018-05-06 (×3): 1 via ORAL
  Filled 2018-05-04 (×3): qty 1

## 2018-05-04 MED ORDER — HYDRALAZINE HCL 10 MG PO TABS
5.0000 mg | ORAL_TABLET | Freq: Three times a day (TID) | ORAL | Status: DC | PRN
  Administered 2018-05-04 – 2018-05-05 (×3): 5 mg via ORAL
  Filled 2018-05-04 (×3): qty 1

## 2018-05-04 MED ORDER — ACETAMINOPHEN 650 MG RE SUPP: 650.0000 mg | Freq: Four times a day (QID) | RECTAL | Status: DC | PRN

## 2018-05-04 MED ORDER — SENNOSIDES-DOCUSATE SODIUM 8.6-50 MG PO TABS
1.0000 | ORAL_TABLET | Freq: Every day | ORAL | Status: DC
  Administered 2018-05-05 – 2018-05-06 (×2): 1 via ORAL
  Filled 2018-05-04 (×3): qty 1

## 2018-05-04 MED ORDER — ACETAMINOPHEN 325 MG PO TABS
650.0000 mg | ORAL_TABLET | Freq: Four times a day (QID) | ORAL | Status: DC | PRN
  Administered 2018-05-04 – 2018-05-05 (×3): 650 mg via ORAL
  Filled 2018-05-04 (×3): qty 2

## 2018-05-04 MED ORDER — ENOXAPARIN SODIUM 40 MG/0.4ML ~~LOC~~ SOLN
40.0000 mg | SUBCUTANEOUS | Status: DC
  Administered 2018-05-04 – 2018-05-06 (×3): 40 mg via SUBCUTANEOUS
  Filled 2018-05-04 (×3): qty 0.4

## 2018-05-04 NOTE — Progress Notes (Signed)
Dr. Dorene Sorrow returned call advised RN he will place order set.

## 2018-05-04 NOTE — Progress Notes (Signed)
As patient arrived BP manually 210/100. Pt was given Norvasc in ED. Pt denies chest pain, dizziness, or blurry vision. Pt states she does have a HA. MD stated to give pt tylenol for HA and to continue to monitor pt. Will continue to monitor.

## 2018-05-04 NOTE — H&P (Signed)
Date: 05/04/2018               Patient Name:  Kristin Morales MRN: [id number]  DOB: 1945-10-02 Age / Sex: 73 y.o., female   PCP: Patient, No Pcp Per         Medical Service: Internal Medicine Teaching Service         Attending Physician: Dr. Virgina Norfolk, DO    First Contact: Dr. Karilyn Cota Pager: [phone]  Second Contact: Dr. Antony Contras Pager: [phone]       After Hours (After 5p/  First Contact Pager: [phone]  weekends / holidays): Second Contact Pager: [phone]   Chief Complaint: Wound infection  History of Present Illness: [name] is a 73 year old female with HTN presenting with a wound infection of her left fourth toe. She sustained a laceration injury to the medial aspect of her left fourth toe and found to have a comminuted distal tuft fracture on 3/9. She had an I&D with repair using absorbable sutures at that time and she was discharged on keflex for 5 days. She states since that time she has been soaking her toe daily in antibacterial soap and warm water. She noticed thick layers of dried blood and had a difficult time getting the original bandage off due to this. She is having a burning sensation in her fourth toe with diminished sensation. She denies any fever or chills. She has had swelling of her foot and lower extremity. She recently moved from [place] and has not followed with a doctor in many years. Denies any past medical problems or diabetes.    Meds:  No outpatient medications have been marked as taking for the 05/04/18 encounter [facility] Encounter).     Allergies: Allergies as of 05/04/2018  . (No Known Allergies)   Past Medical History:  Diagnosis Date  . Kidney stones     Family History:  History reviewed. No pertinent family history.  Social History: Recently moved from [place]. Does not follow with a PCP. Has been smoking cigarettes on and off since the age of 19-20, currently smokes 5 a day. Denies alcohol or illicit drug use.   Social History   Socioeconomic History  . Marital status: Widowed    Spouse name: Not on file  . Number of children: Not on file  . Years of education: Not on file  . Highest education level: Not on file  Occupational History  . Not on file  Social Needs  . Financial resource strain: Not on file  . Food insecurity:    Worry: Not on file    Inability: Not on file  . Transportation needs:    Medical: Not on file    Non-medical: Not on file  Tobacco Use  . Smoking status: Current Every Day Smoker  . Smokeless tobacco: Never Used  Substance and Sexual Activity  . Alcohol use: Never    Frequency: Never  . Drug use: Never  . Sexual activity: Not on file  Lifestyle  . Physical activity:    Days per week: Not on file    Minutes per session: Not on file  . Stress: Not on file  Relationships  . Social connections:    Talks on phone: Not on file    Gets together: Not on file    Attends religious service: Not on file    Active member of club or organization: Not on file    Attends meetings of clubs or organizations: Not  on file    Relationship status: Not on file  . Intimate partner violence:    Fear of current or ex partner: Not on file    Emotionally abused: Not on file    Physically abused: Not on file    Forced sexual activity: Not on file  Other Topics Concern  . Not on file  Social History Narrative  . Not on file   Review of Systems: A complete ROS was negative except as per HPI.   Physical Exam: Blood pressure (!) 220/110, pulse 96, temperature 98 F (36.7 C), temperature source Oral, resp. rate 16, SpO2 94 %.  Physical Exam  Constitutional: She is oriented to person, place, and time and well-developed, well-nourished, and in no distress.  Eyes: Conjunctivae are normal. Right eye exhibits no discharge. Left eye exhibits no discharge.  Cardiovascular: Normal rate, regular rhythm and normal heart sounds.  No murmur heard. Pulmonary/Chest: Effort normal and  breath sounds normal. No respiratory distress. She has no wheezes. She has no rales.  Abdominal: Soft. Bowel sounds are normal. She exhibits no distension. There is no abdominal tenderness.  Musculoskeletal:        General: Edema present.     Comments: Left fourth toe with necrotic black tissue, no drainage or pus, diminished sensation to fourth toe, left foot and LE edema, left foot warm to touch, no pulse appreciated   Neurological: She is alert and oriented to person, place, and time.  Skin: Skin is warm and dry.  Psychiatric: Memory, affect and judgment normal.    EKG: n/a  CXR: n/a  Foot Xray IMPRESSION: Soft tissue swelling and suboptimally visualized fourth phalangeal tuft fracture. More diffuse soft tissue swelling about the forefoot and midfoot dorsally.  Assessment & Plan by Problem: Active Problems:   HTN (hypertension)   Wound infection  [name] is a 73 year old female with HTN presenting with a wound infection of her left fourth toe. Sustained an injury to her left fourth toe and distal tuft fracture on 3/9. Underwent an I&D with laceration repair using absorbable sutures.   Wound infection: Underwent an I&D with laceration repair using absorbable sutures on 3/9. Afebrile with no leukocytosis. Foot xray consistent with cellulitis. On exam, left foot swelling with black necrotic tissue on medial aspect of fourth toe.  - Orthopedics consulted, appreciate recommendations - IV clindamycin  - Pain control with hydrocodone-acetaminophen 5-325 mg q4h prn   HTN: Denies any history of HTN. Started on amlodipine 10 mg. Will monitor   Diet: Heart healthy  VTE ppx: Lovenox Full Code  Dispo: Admit patient to Observation with expected length of stay less than 2 midnights.  SignedJaci Standard, DO 05/04/2018, 1:13 PM  Pager: [phone]

## 2018-05-04 NOTE — ED Notes (Signed)
Patient transported to X-ray 

## 2018-05-04 NOTE — Progress Notes (Signed)
Paged doctor related to patient's manual blood pressure 199/95.

## 2018-05-04 NOTE — ED Notes (Signed)
Ambulated Pt to the restroom with steady gait, no apparent distress noted.

## 2018-05-04 NOTE — ED Triage Notes (Signed)
Pt reports her toe has been bleeding/leaking fluids since the 9th and does not think the stitches held up well. Pt denies any fevers.

## 2018-05-04 NOTE — ED Notes (Signed)
ED TO INPATIENT HANDOFF REPORT  ED Nurse Name and Phone #: Magda Paganini @ [phone]  S Name/Age/Gender Kristin Morales 73 y.o. female Room/Bed: 006C/006C  Code Status   Code Status: Not on file  Home/SNF/Other    Patient oriented to: self, place, time and situation Is this baseline? Yes   Triage Complete: Triage complete  Chief Complaint left toe injury  Triage Note Pt reports her toe has been bleeding/leaking fluids since the 9th and does not think the stitches held up well. Pt denies any fevers.    Allergies No Known Allergies  Level of Care/Admitting Diagnosis ED Disposition    ED Disposition Condition Comment   Admit  Hospital Area: [facility] [[id number]]  Level of Care: Med-Surg [16]  Diagnosis: Wound infection [166063]  Admitting Physician: Silvio Pate  Attending Physician: HOFFMAN, ERIK C [[id number]  PT Class (Do Not Modify): Observation [104]  PT Acc Code (Do Not Modify): Observation [10022]       B Medical/Surgery History Past Medical History:  Diagnosis Date  . Kidney stones    Past Surgical History:  Procedure Laterality Date  . APPENDECTOMY    . CHOLECYSTECTOMY    . HEMORRHOID SURGERY    . TUBAL LIGATION       A IV Location/Drains/Wounds Patient Lines/Drains/Airways Status   Active Line/Drains/Airways    Name:   Placement date:   Placement time:   Site:   Days:   Peripheral IV 05/04/18 Right Antecubital   05/04/18    1137    Antecubital   less than 1          Intake/Output Last 24 hours  Intake/Output Summary (Last 24 hours) at 05/04/2018 1421 Last data filed at 05/04/2018 1418 Gross per 24 hour  Intake 50 ml  Output -  Net 50 ml    Labs/Imaging Results for orders placed or performed during the hospital encounter of 05/04/18 (from the past 48 hour(s))  CBC with Differential     Status: None   Collection Time: 05/04/18 11:38 AM  Result Value Ref Range   WBC 7.9 4.0 - 10.5 K/uL   RBC 4.67 3.87 - 5.11 MIL/uL   Hemoglobin 13.5 12.0 - 15.0 g/dL   HCT 01.6 01.0 - 93.2 %   MCV 92.5 80.0 - 100.0 fL   MCH 28.9 26.0 - 34.0 pg   MCHC 31.3 30.0 - 36.0 g/dL   RDW 35.5 73.2 - 20.2 %   Platelets 242 150 - 400 K/uL   nRBC 0.0 0.0 - 0.2 %   Neutrophils Relative % 63 %   Neutro Abs 5.0 1.7 - 7.7 K/uL   Lymphocytes Relative 28 %   Lymphs Abs 2.2 0.7 - 4.0 K/uL   Monocytes Relative 6 %   Monocytes Absolute 0.5 0.1 - 1.0 K/uL   Eosinophils Relative 2 %   Eosinophils Absolute 0.2 0.0 - 0.5 K/uL   Basophils Relative 1 %   Basophils Absolute 0.1 0.0 - 0.1 K/uL   Immature Granulocytes 0 %   Abs Immature Granulocytes 0.02 0.00 - 0.07 K/uL    Comment: Performed at [facility], [address] [place], [place] [address]  Basic metabolic panel     Status: Abnormal   Collection Time: 05/04/18 11:38 AM  Result Value Ref Range   Sodium 136 135 - 145 mmol/L   Potassium 4.1 3.5 - 5.1 mmol/L   Chloride 102 98 - 111 mmol/L   CO2 22 22 - 32 mmol/L  Glucose, Bld 101 (H) 70 - 99 mg/dL   BUN 15 8 - 23 mg/dL   Creatinine, Ser 7.41 0.44 - 1.00 mg/dL   Calcium 9.3 8.9 - 63.8 mg/dL   GFR calc non Af Amer >60 >60 mL/min   GFR calc Af Amer >60 >60 mL/min   Anion gap 12 5 - 15    Comment: Performed at [facility], [address], [place], [place] [address]  Sedimentation rate     Status: Abnormal   Collection Time: 05/04/18 11:38 AM  Result Value Ref Range   Sed Rate 39 (H) 0 - 22 mm/hr    Comment: Performed at [facility], [address] [place], [place] [address]  C-reactive protein     Status: None   Collection Time: 05/04/18 12:18 PM  Result Value Ref Range   CRP <0.8 <1.0 mg/dL    Comment: Performed at [facility], [address], [place], [place] [address]   Dg Foot Complete Left  Result Date: 05/04/2018 CLINICAL DATA:  Bleeding and leaking fluids about the fourth toe. Prior fracture. EXAM: LEFT FOOT - COMPLETE 3+ VIEW COMPARISON:  Toe radiographs of 04/15/2018 FINDINGS: Overlap of  digits on the lateral view. Moderate dorsal soft tissue swelling about the midfoot. Redemonstration of fourth phalangeal tuft fracture, suboptimally evaluated. No soft tissue gas. Mild soft tissue swelling about the fourth phalanx remains. IMPRESSION: Soft tissue swelling and suboptimally visualized fourth phalangeal tuft fracture. More diffuse soft tissue swelling about the forefoot and midfoot dorsally. Electronically Signed   By: Jeronimo Greaves M.D.   On: 05/04/2018 12:31    Pending Labs [employer] (From admission, onward)    Start     Ordered   Signed and Armed forces training and education officer morning,   R     Signed and Held   Signed and Held  CBC  Tomorrow morning,   R     Signed and Held          Vitals/Pain Today's Vitals   05/04/18 1122 05/04/18 1123 05/04/18 1415  BP:  (!) 220/110 (!) 188/98  Pulse:  96   Resp:  16   Temp:  98 F (36.7 C)   TempSrc:  Oral   SpO2:  94%   PainSc: 2       Isolation Precautions No active isolations  Medications Medications  amLODipine (NORVASC) tablet 10 mg (10 mg Oral Given 05/04/18 1415)  clindamycin (CLEOCIN) IVPB 900 mg (0 mg Intravenous Stopped 05/04/18 1418)    Mobility walks Low fall risk   Focused Assessments see charg   R Recommendations: See Admitting Provider Note  Report given to:   Additional Notes:

## 2018-05-04 NOTE — Progress Notes (Signed)
Spoke with MD regarding pt's BP still being elevated 215/81. MD explained to continue to monitor pt. Will continue to monitor pt. Pt denies chest pain, dizziness, blurred vision, pt states HA is "not as bad." Will continue to monitor pt.

## 2018-05-04 NOTE — Progress Notes (Signed)
Appreciate consult with full evaluation to follow.  Cellulitis s/p right 4th toe trauma with open tuft fracture in 73 yo without h/o DM. Patient underwent I&D with laceration repair using absorbable suture at patient's insistence. No follow up in interim and possibly no dressing change or wound care per MD's report today. Increasing redness and tenderness with associated foot swelling. Scant clear drainage today but edges of eschar sealed.  At this point most advisable to treat as a cellulitis with IV abx and eventual transition to oral abx. Once surrounding tissues have adequate health to allow for healing will likely need amputation although small chance of underlying recovery and healing with local wound care. Should be clear in a few days.  Will see patient later today or in am depending on availability.  Myrene Galas, MD [facility], [facility] [phone]

## 2018-05-04 NOTE — ED Provider Notes (Signed)
[facility] EMERGENCY DEPARTMENT Provider Note   CSN: [id number] Arrival date & time: 05/04/18  1118    History   Chief Complaint Chief Complaint  Patient presents with  . Wound Check    HPI Kristin Morales is a 73 y.o. female.     The history is provided by the patient.  Wound Check  This is a chronic problem. Episode onset: 3 weeks ago got stitches to left 4th digit, toe has turned black and a few stiches now embedded and never came out. The problem occurs constantly. The problem has been gradually worsening. Pertinent negatives include no chest pain, no abdominal pain, no headaches and no shortness of breath. Nothing aggravates the symptoms. Nothing relieves the symptoms. She has tried nothing for the symptoms. The treatment provided no relief.    Past Medical History:  Diagnosis Date  . Kidney stones     There are no active problems to display for this patient.   Past Surgical History:  Procedure Laterality Date  . APPENDECTOMY    . CHOLECYSTECTOMY    . HEMORRHOID SURGERY    . TUBAL LIGATION       OB History   No obstetric history on file.      Home Medications    Prior to Admission medications   Medication Sig Start Date End Date Taking? Authorizing Provider  HYDROcodone-acetaminophen (NORCO/VICODIN) 5-325 MG tablet Take 1 tablet by mouth every 6 (six) hours as needed for severe pain. 04/15/18   Jeanie Sewer, PA-C    Family History History reviewed. No pertinent family history.  Social History Social History   Tobacco Use  . Smoking status: Current Every Day Smoker  . Smokeless tobacco: Never Used  Substance Use Topics  . Alcohol use: Never    Frequency: Never  . Drug use: Never     Allergies   Patient has no known allergies.   Review of Systems Review of Systems  Constitutional: Negative for chills and fever.  HENT: Negative for ear pain and sore throat.   Eyes: Negative for pain and visual disturbance.  Respiratory:  Negative for cough and shortness of breath.   Cardiovascular: Negative for chest pain and palpitations.  Gastrointestinal: Negative for abdominal pain and vomiting.  Genitourinary: Negative for dysuria and hematuria.  Musculoskeletal: Positive for arthralgias and joint swelling. Negative for back pain.  Skin: Negative for color change and rash.  Neurological: Negative for seizures, syncope and headaches.  All other systems reviewed and are negative.    Physical Exam Updated Vital Signs   ED Triage Vitals  Enc Vitals Group     BP 05/04/18 1123 (!) 220/110     Pulse Rate 05/04/18 1123 96     Resp 05/04/18 1123 16     Temp 05/04/18 1123 98 F (36.7 C)     Temp Source 05/04/18 1123 Oral     SpO2 05/04/18 1123 94 %     Weight --      Height --      Head Circumference --      Peak Flow --      Pain Score 05/04/18 1122 2     Pain Loc --      Pain Edu? --      Excl. in GC? --     Physical Exam Vitals signs and nursing note reviewed.  Constitutional:      General: She is not in acute distress.    Appearance: She is well-developed.  HENT:  Head: Normocephalic and atraumatic.  Eyes:     Extraocular Movements: Extraocular movements intact.     Conjunctiva/sclera: Conjunctivae normal.     Pupils: Pupils are equal, round, and reactive to light.  Neck:     Musculoskeletal: Neck supple.  Cardiovascular:     Rate and Rhythm: Normal rate and regular rhythm.     Pulses: Normal pulses.     Heart sounds: Normal heart sounds. No murmur.  Pulmonary:     Effort: Pulmonary effort is normal. No respiratory distress.     Breath sounds: Normal breath sounds.  Abdominal:     Palpations: Abdomen is soft.     Tenderness: There is no abdominal tenderness.  Musculoskeletal:        General: Tenderness (left 4th toe) present.  Skin:    General: Skin is warm and dry.     Findings: Rash present.     Comments: Left fourth toe is black and necrotic, there is surrounding redness up around  the base of the foot as well with edema, there are several stitches embedded within the skin  Neurological:     Mental Status: She is alert.      ED Treatments / Results  Labs (all labs ordered are listed, but only abnormal results are displayed) Labs Reviewed  BASIC METABOLIC PANEL - Abnormal; Notable for the following components:      Result Value   Glucose, Bld 101 (*)    All other components within normal limits  CBC WITH DIFFERENTIAL/PLATELET  C-REACTIVE PROTEIN  SEDIMENTATION RATE    EKG None  Radiology Dg Foot Complete Left  Result Date: 05/04/2018 CLINICAL DATA:  Bleeding and leaking fluids about the fourth toe. Prior fracture. EXAM: LEFT FOOT - COMPLETE 3+ VIEW COMPARISON:  Toe radiographs of 04/15/2018 FINDINGS: Overlap of digits on the lateral view. Moderate dorsal soft tissue swelling about the midfoot. Redemonstration of fourth phalangeal tuft fracture, suboptimally evaluated. No soft tissue gas. Mild soft tissue swelling about the fourth phalanx remains. IMPRESSION: Soft tissue swelling and suboptimally visualized fourth phalangeal tuft fracture. More diffuse soft tissue swelling about the forefoot and midfoot dorsally. Electronically Signed   By: Jeronimo Greaves M.D.   On: 05/04/2018 12:31    Procedures Procedures (including critical care time)  Medications Ordered in ED Medications  clindamycin (CLEOCIN) IVPB 900 mg (has no administration in time range)     Initial Impression / Assessment and Plan / ED Course  I have reviewed the triage vital signs and the nursing notes.  Pertinent labs & imaging results that were available during my care of the patient were reviewed by me and considered in my medical decision making (see chart for details).     Deborha Si is a 73 year old female with history of hypertension who presents to the ED for wound recheck.  Patient hypertension but otherwise normal vitals.  Patient had sutures placed several weeks ago to left  toe laceration.  Toe overall appears necrotic with surrounding cellulitis on the forefoot.  There is some serosanguineous drainage to the bottom of his toe.  X-ray shows distal tuft fracture that was seen on prior x-ray. No signs of osteomyelitis. Patient still has absorbable sutures embedded within this infectious toe.  I was able to remove several sutures but some still remain due to being embedded within the skin.  Patient has normal white blood cell count, overall normal labs.  Talked with Dr. Carola Frost with orthopedics and he recommends IV clindamycin and they will come evaluate  the patient for any further surgical treatment.  At this time he recommends IV antibiotics and admission to medicine.  Patient also has uncontrolled hypertension.  To be admitted to medicine for further care.  This chart was dictated using voice recognition software.  Despite best efforts to proofread,  errors can occur which can change the documentation meaning.    Final Clinical Impressions(s) / ED Diagnoses   Final diagnoses:  Wound infection  Cellulitis, unspecified cellulitis site  Asymptomatic hypertension    ED Discharge Orders    None       Virgina NorfolkCuratolo, Jamon Hayhurst, DO 05/04/18 1253

## 2018-05-05 DIAGNOSIS — I160 Hypertensive urgency: Secondary | ICD-10-CM | POA: Diagnosis present

## 2018-05-05 DIAGNOSIS — F17210 Nicotine dependence, cigarettes, uncomplicated: Secondary | ICD-10-CM | POA: Diagnosis present

## 2018-05-05 DIAGNOSIS — I10 Essential (primary) hypertension: Secondary | ICD-10-CM | POA: Diagnosis present

## 2018-05-05 DIAGNOSIS — I96 Gangrene, not elsewhere classified: Secondary | ICD-10-CM | POA: Diagnosis present

## 2018-05-05 DIAGNOSIS — L03032 Cellulitis of left toe: Secondary | ICD-10-CM | POA: Diagnosis present

## 2018-05-05 DIAGNOSIS — W208XXA Other cause of strike by thrown, projected or falling object, initial encounter: Secondary | ICD-10-CM | POA: Diagnosis present

## 2018-05-05 DIAGNOSIS — S99922A Unspecified injury of left foot, initial encounter: Secondary | ICD-10-CM

## 2018-05-05 DIAGNOSIS — D62 Acute posthemorrhagic anemia: Secondary | ICD-10-CM | POA: Diagnosis present

## 2018-05-05 DIAGNOSIS — S92502A Displaced unspecified fracture of left lesser toe(s), initial encounter for closed fracture: Secondary | ICD-10-CM | POA: Diagnosis present

## 2018-05-05 LAB — CBC
HEMATOCRIT: 38.6 % (ref 36.0–46.0)
Hemoglobin: 12.7 g/dL (ref 12.0–15.0)
MCH: 30.3 pg (ref 26.0–34.0)
MCHC: 32.9 g/dL (ref 30.0–36.0)
MCV: 92.1 fL (ref 80.0–100.0)
Platelets: 218 10*3/uL (ref 150–400)
RBC: 4.19 MIL/uL (ref 3.87–5.11)
RDW: 13.3 % (ref 11.5–15.5)
WBC: 7 10*3/uL (ref 4.0–10.5)
nRBC: 0 % (ref 0.0–0.2)

## 2018-05-05 LAB — BASIC METABOLIC PANEL
ANION GAP: 10 (ref 5–15)
BUN: 13 mg/dL (ref 8–23)
CO2: 25 mmol/L (ref 22–32)
Calcium: 9.2 mg/dL (ref 8.9–10.3)
Chloride: 103 mmol/L (ref 98–111)
Creatinine, Ser: 0.68 mg/dL (ref 0.44–1.00)
GFR calc non Af Amer: >60 mL/min (ref >60)
Glucose, Bld: 101 mg/dL — ABNORMAL HIGH (ref 70–99)
Potassium: 3.8 mmol/L (ref 3.5–5.1)
Sodium: 138 mmol/L (ref 135–145)

## 2018-05-05 MED ORDER — CLINDAMYCIN PHOSPHATE 600 MG/50ML IV SOLN
600.0000 mg | Freq: Three times a day (TID) | INTRAVENOUS | Status: DC
  Administered 2018-05-05 – 2018-05-06 (×4): 600 mg via INTRAVENOUS
  Filled 2018-05-05 (×7): qty 50

## 2018-05-05 NOTE — Progress Notes (Signed)
   Subjective: No overnight events. Patient states she did not sleep much due to a headache mostly on the right side of her head. No other acute complaints. States her left foot swelling has gone down. She was doing foot soaks/baths because she was advised to.   Objective:  Vital signs in last 24 hours: Vitals:   05/04/18 1500 05/04/18 1718 05/04/18 1929 05/05/18 0208  BP: (!) 210/100 (!) 215/81 (!) 199/95 (!) 160/72  Pulse: 83 75 87 64  Resp: 16  18 15   Temp: 98 F (36.7 C)  98.1 F (36.7 C) 98.2 F (36.8 C)  TempSrc: Oral  Oral Oral  SpO2: 97%  97% 95%   Physical Exam Gen: seen laying comfortably in bed, no distress Ext: left LE 2+ pitting edema, left fourth toe with ?necrotic black tissue, no drainage or pus, diminished sensation to fourth toe, left foot warm to touch, pulse intact HENT: posterior neck tender to palpation  Assessment/Plan:  Principal Problem:   Wound infection  Kristin Morales is a 73 year old female with HTN presenting with a wound infection of her left fourth toe. Sustained an injury to her left fourth toe and distal tuft fracture on 3/9. Underwent an I&D with laceration repair using absorbable sutures.  Wound infection: Underwent an I&D with laceration repair using absorbable sutures on 3/9. Afebrile with no leukocytosis. Will continue to treat with IV antibiotics to allow for healing. She may possibly need surgery depending on recovery. - Orthopedics consulted, appreciate recommendations - IV clindamycin  - Pain control with hydrocodone-acetaminophen 5-325 mg q4h prn   HTN: 160/72. Started on amlodipine 10 mg. Given two doses of PRN oral hydralazine 5 mg  - Continue amlodipine 10 mg   Dispo: Anticipated discharge pending clinical improvement and ortho recommendations.   Jaci Standard, DO 05/05/2018, 8:49 AM Pager: [phone]

## 2018-05-05 NOTE — Consult Note (Signed)
Orthopaedic Trauma Service (OTS) Consult   Patient ID: Miles Borkowski MRN: [id number] DOB/AGE: March 13, 1945 73 y.o.   Reason for Consult: Left fourth toe following laceration repair 3 weeks ago Referring Physician: Virgina Norfolk, MD (EDP)   HPI: Julie-Anne Torain is an 73 y.o.white female with a history of nicotine dependence and likely undiagnosed and uncontrolled hypertension who sustained an injury to her left fourth toe about 3 weeks ago.  Patient was helping her daughter move some boxes when something fell out of a box and lacerated her fourth toe.  Patient presented to the emergency room for evaluation where I&D was performed.  She was noted to have a distal tuft fracture along with the laceration.  Resorbable suture closure was performed and patient was given wound care instructions.  She states that about 3 days later she started doing soaks to her foot.  Over the last several days she is noted increased swelling and redness along with what she thought was dried blood that was not able to be clean on the medial aspect of her fourth toe so she presented to the emergency department for further evaluation.  She denies any recent illnesses does not feel sick otherwise does have some mild pain but is controlled she has pretty moderate swelling to her left foot and ankle.   CRP on admission is less than 0.8 and sed rate is mildly elevated at 39.  There is no elevated white count.  CBGs also appear to be within normal limits.   Orthopedist consulted regarding necrotic toe on her left foot.  Patient is quite adamant about avoiding amputation if at all possible   States that her redness and swelling have improved with IV antibiotics  Patient has been afebrile since admission  Really only vital sign anomalies are her severely elevated blood pressures with systolics ranging anywhere from the 180s to the 220s and diastolic in the 80s to 110s  She has been complaining about headache but  suspect this is related to her hypertension   Patient is very independent   Patient states that she smokes about 3 to 4 cigarettes a day   With respect to her original admission and injury I am unable to decipher how much irrigation was used to irrigate her wound.  Clinical images were reviewed in the chart.  It appears that there were 2 types of anesthesia methods performed for her repair including topical application which include lidocaine with epi and then local infiltration of the toe itself with plain lidocaine.  Per chart record 9 sutures were placed 4-0 Vicryl.  There appear to be an additional laceration that was closed with one 4-0 Vicryl interrupted suture.  She was discharged home with Keflex  Past Medical History:  Diagnosis Date  . Kidney stones     Past Surgical History:  Procedure Laterality Date  . APPENDECTOMY    . CHOLECYSTECTOMY    . HEMORRHOID SURGERY    . TUBAL LIGATION      History reviewed. No pertinent family history.  Social History:  reports that she has been smoking. She has never used smokeless tobacco. She reports that she does not drink alcohol or use drugs.  Allergies: No Known Allergies  Medications: I have reviewed the patient's current medications. Current Meds  Medication Sig  . acetaminophen (TYLENOL) 325 MG tablet Take 650 mg by mouth every 6 (six) hours as needed for headache.     Results for orders placed or performed  during the hospital encounter of 05/04/18 (from the past 48 hour(s))  CBC with Differential     Status: None   Collection Time: 05/04/18 11:38 AM  Result Value Ref Range   WBC 7.9 4.0 - 10.5 K/uL   RBC 4.67 3.87 - 5.11 MIL/uL   Hemoglobin 13.5 12.0 - 15.0 g/dL   HCT 21.1 17.3 - 56.7 %   MCV 92.5 80.0 - 100.0 fL   MCH 28.9 26.0 - 34.0 pg   MCHC 31.3 30.0 - 36.0 g/dL   RDW 01.4 10.3 - 01.3 %   Platelets 242 150 - 400 K/uL   nRBC 0.0 0.0 - 0.2 %   Neutrophils Relative % 63 %   Neutro Abs 5.0 1.7 - 7.7 K/uL    Lymphocytes Relative 28 %   Lymphs Abs 2.2 0.7 - 4.0 K/uL   Monocytes Relative 6 %   Monocytes Absolute 0.5 0.1 - 1.0 K/uL   Eosinophils Relative 2 %   Eosinophils Absolute 0.2 0.0 - 0.5 K/uL   Basophils Relative 1 %   Basophils Absolute 0.1 0.0 - 0.1 K/uL   Immature Granulocytes 0 %   Abs Immature Granulocytes 0.02 0.00 - 0.07 K/uL    Comment: Performed at [facility], [address] [place], [place] [address]  Basic metabolic panel     Status: Abnormal   Collection Time: 05/04/18 11:38 AM  Result Value Ref Range   Sodium 136 135 - 145 mmol/L   Potassium 4.1 3.5 - 5.1 mmol/L   Chloride 102 98 - 111 mmol/L   CO2 22 22 - 32 mmol/L   Glucose, Bld 101 (H) 70 - 99 mg/dL   BUN 15 8 - 23 mg/dL   Creatinine, Ser 8.75 0.44 - 1.00 mg/dL   Calcium 9.3 8.9 - 79.7 mg/dL   GFR calc non Af Amer >60 >60 mL/min   GFR calc Af Amer >60 >60 mL/min   Anion gap 12 5 - 15    Comment: Performed at [facility], [address] [place], [place] [address]  Sedimentation rate     Status: Abnormal   Collection Time: 05/04/18 11:38 AM  Result Value Ref Range   Sed Rate 39 (H) 0 - 22 mm/hr    Comment: Performed at [facility], [address], [place], [place] [address]  C-reactive protein     Status: None   Collection Time: 05/04/18 12:18 PM  Result Value Ref Range   CRP <0.8 <1.0 mg/dL    Comment: Performed at [facility], [address] [place], [place] [address]  Basic metabolic panel     Status: Abnormal   Collection Time: 05/05/18  3:11 AM  Result Value Ref Range   Sodium 138 135 - 145 mmol/L   Potassium 3.8 3.5 - 5.1 mmol/L   Chloride 103 98 - 111 mmol/L   CO2 25 22 - 32 mmol/L   Glucose, Bld 101 (H) 70 - 99 mg/dL   BUN 13 8 - 23 mg/dL   Creatinine, Ser 3.27 0.44 - 1.00 mg/dL   Calcium 9.2 8.9 - 61.4 mg/dL   GFR calc non Af Amer >60 >60 mL/min   GFR calc Af Amer >60 >60 mL/min   Anion gap 10 5 - 15    Comment: Performed at [facility], [address] [place], [place] [address]  CBC     Status: None   Collection Time: 05/05/18  3:11 AM  Result Value Ref Range   WBC 7.0 4.0 - 10.5 K/uL   RBC 4.19 3.87 - 5.11 MIL/uL   Hemoglobin 12.7 12.0 - 15.0 g/dL   HCT 78.2 95.6 - 21.3 %   MCV 92.1 80.0 - 100.0 fL   MCH 30.3 26.0 - 34.0 pg   MCHC 32.9 30.0 - 36.0 g/dL   RDW 08.6 57.8 - 46.9 %   Platelets 218 150 - 400 K/uL   nRBC 0.0 0.0 - 0.2 %    Comment: Performed at [facility], [address] [place], [place] [address]    Dg Foot Complete Left  Result Date: 05/04/2018 CLINICAL DATA:  Bleeding and leaking fluids about the fourth toe. Prior fracture. EXAM: LEFT FOOT - COMPLETE 3+ VIEW COMPARISON:  Toe radiographs of 04/15/2018 FINDINGS: Overlap of digits on the lateral view. Moderate dorsal soft tissue swelling about the midfoot. Redemonstration of fourth phalangeal tuft fracture, suboptimally evaluated. No soft tissue gas. Mild soft tissue swelling about the fourth phalanx remains. IMPRESSION: Soft tissue swelling and suboptimally visualized fourth phalangeal tuft fracture. More diffuse soft tissue swelling about the forefoot and midfoot dorsally. Electronically Signed   By: Jeronimo Greaves M.D.   On: 05/04/2018 12:31    Review of Systems  Constitutional: Negative for chills and fever.  Respiratory: Negative for shortness of breath and wheezing.   Cardiovascular: Negative for chest pain and palpitations.  Gastrointestinal: Negative for abdominal pain, nausea and vomiting.  Neurological: Negative for tingling and sensory change.   Blood pressure (!) 183/84, pulse 86, temperature 98 F (36.7 C), temperature source Oral, resp. rate 20, SpO2 94 %. Physical Exam Vitals signs and nursing note reviewed.  Constitutional:      General: She is awake.     Appearance: She is well-developed.     Comments: Very pleasant white female, sitting on the edge of the bed eating lunch She does not appear ill and is nontoxic-appearing  Cardiovascular:      Rate and Rhythm: Normal rate and regular rhythm.  Pulmonary:     Effort: Pulmonary effort is normal. No tachypnea, accessory muscle usage or respiratory distress.  Musculoskeletal:     Comments: Left lower extremity (focused examination)  Skin mark noted to the proximal aspect of the dorsum of the left foot.  It does appear that her erythema has subsided relative to this mark  Her lower extremities do have skin changes soft tissue changes consistent with peripheral vascular disease DP pulse is palpable on the left but is somewhat difficult to ascertain palpable PT pulses noted  Fairly moderate edema is present to the foot and ankle.  It is pitting and is asymmetric to the contralateral side  She does not have any calf tenderness  Examination of her left fourth toe is notable for significant eschar medially from the tip of her toe extending proximally beyond the PIP joint There is some mild maceration proximally The eschar is well fixed No odor or purulence appreciated Sensation is diminished along her fourth toe  She does have some mild tenderness with palpation over the head of the third and fourth metatarsals.  There is no significant erythema or appreciable fluctuance plantarly.  Again diffuse edema noted along the dorsum of her foot  Motor functions are intact otherwise to her toes ankle and without significant discomfort  Please see picture  Neurological:     Mental Status: She is alert and oriented to person, place, and time.  Psychiatric:  Attention and Perception: Attention and perception normal.        Mood and Affect: Mood and affect normal.        Speech: Speech normal.        Behavior: Behavior is cooperative.        Cognition and Memory: Cognition and memory normal.      Assessment/Plan:  73 year old female 3 weeks s/p open distal tuft fracture of L 4th toe with cellulitis and eschar formation of the medial aspect of the left toe.  -Open distal tuft  fracture left fourth toe with cellulitis and eschar  Does appear that the patient had a full-thickness flap injury to her left fourth toe  Eschar is well fixed and is without purulence despite some cellulitis which has improved since admission   Do not think that there is an acute indication for amputation at this time and that we can continue with further work-up as the patient is not acutely ill   Was most concerning is her continued nicotine use and uncontrolled hypertension which suggests underlying vascular disease which likely has compromised her ability to heal   We will proceed with obtaining ankle-brachial indices as well as an MRI of her left foot, will also check HgbA1C.  I do think that this will provide valuable information and going forth   1.  To see if there is in fact any baseline peripheral vascular disease which will have direct bearing on her ability to heal   2.  To see if there is any evidence of osteomyelitis   We will discuss this case with Dr. Aldean Baker and likely obtain his input as well   Again patient is quite adamant about avoiding amputation.  But again concern exists that her eschar is full-thickness and that once this were to be debrided there would be exposed bone and therefore amputation likely best option however no acute indication exists at this time.  - Pain management:  Continue with current regimen  - ABL anemia/Hemodynamics  Uncontrolled hypertension per primary team  - Medical issues   As above  - ID:   Continue with IV antibiotics  Could consider transitioning to orals tomorrow and keep hospitalized to make sure she does not regress   - Impediments to fracture and soft tissue healing:  Nicotine use  Uncontrolled hypertension  - Dispo:  Continue further work-up  Continue with current care    Mearl Latin, PA-C [phone] (C) 05/05/2018, 1:17 PM  [facility] [address] [place] [address]  [phone] Collier Bullock (F)

## 2018-05-06 ENCOUNTER — Inpatient Hospital Stay (HOSPITAL_COMMUNITY): Payer: Medicare Other

## 2018-05-06 ENCOUNTER — Encounter (HOSPITAL_COMMUNITY): Payer: Self-pay | Admitting: General Practice

## 2018-05-06 ENCOUNTER — Other Ambulatory Visit: Payer: Self-pay

## 2018-05-06 DIAGNOSIS — I96 Gangrene, not elsewhere classified: Principal | ICD-10-CM

## 2018-05-06 DIAGNOSIS — T148XXA Other injury of unspecified body region, initial encounter: Secondary | ICD-10-CM

## 2018-05-06 DIAGNOSIS — L0390 Cellulitis, unspecified: Secondary | ICD-10-CM

## 2018-05-06 DIAGNOSIS — L089 Local infection of the skin and subcutaneous tissue, unspecified: Secondary | ICD-10-CM

## 2018-05-06 LAB — CBC
HCT: 38.4 % (ref 36.0–46.0)
Hemoglobin: 12.2 g/dL (ref 12.0–15.0)
MCH: 29.1 pg (ref 26.0–34.0)
MCHC: 31.8 g/dL (ref 30.0–36.0)
MCV: 91.6 fL (ref 80.0–100.0)
PLATELETS: 229 10*3/uL (ref 150–400)
RBC: 4.19 MIL/uL (ref 3.87–5.11)
RDW: 13.2 % (ref 11.5–15.5)
WBC: 7 10*3/uL (ref 4.0–10.5)
nRBC: 0 % (ref 0.0–0.2)

## 2018-05-06 LAB — HEMOGLOBIN A1C
Hgb A1c MFr Bld: 5.5 % (ref 4.8–5.6)
Mean Plasma Glucose: 111.15 mg/dL

## 2018-05-06 MED ORDER — GADOBUTROL 1 MMOL/ML IV SOLN
6.0000 mL | Freq: Once | INTRAVENOUS | Status: AC | PRN
  Administered 2018-05-06: 6 mL via INTRAVENOUS

## 2018-05-06 MED ORDER — HYDROCHLOROTHIAZIDE 25 MG PO TABS
25.0000 mg | ORAL_TABLET | Freq: Every day | ORAL | Status: DC
  Administered 2018-05-06 – 2018-05-07 (×2): 25 mg via ORAL
  Filled 2018-05-06 (×2): qty 1

## 2018-05-06 MED ORDER — CLINDAMYCIN HCL 300 MG PO CAPS
600.0000 mg | ORAL_CAPSULE | Freq: Three times a day (TID) | ORAL | Status: DC
  Administered 2018-05-06 – 2018-05-07 (×3): 600 mg via ORAL
  Filled 2018-05-06 (×3): qty 2

## 2018-05-06 NOTE — Progress Notes (Signed)
   Subjective: No overnight events. Patient reports feeling well this morning. States pain and burning sensation have improved. She has noticed some drainage on her gauze. All questions and concerns addressed.  Objective:  Vital signs in last 24 hours: Vitals:   05/05/18 1037 05/05/18 1312 05/05/18 1807 05/06/18 0217  BP: (!) 164/71 (!) 183/84 (!) 191/77 (!) 166/85  Pulse: 66 86 78 68  Resp: 20  20 16   Temp: 98.1 F (36.7 C) 98 F (36.7 C) 98.2 F (36.8 C) 98.1 F (36.7 C)  TempSrc: Oral Oral Oral Oral  SpO2: 94%  100% 96%   Physical Exam Gen: seen comfortably resting in bed, no distress Ext: bilateral LE edema, intact pulses, warm and well perfused, left fourth toe with ?necrotic black tissue, no drainage or pus, diminished sensation to fourth toe  Assessment/Plan:  Principal Problem:   Wound infection Active Problems:   HTN (hypertension)  Ms. Cailynne Weingarten is a 73 year old female with HTN presenting with a wound infection of her left fourth toe.Sustained an injury to her left fourth toe and distal tuft fracture on 3/9. Underwent an I&D with laceration repair using absorbable sutures.  Wound infection: Underwent an I&D with laceration repair using absorbable sutures on 3/9. Afebrile with no leukocytosis. Per ortho, no acute indication for amputation at this time but may need this down the road. Switched to oral clindamycin today.  - Orthopedics consulted, appreciate recommendations - Switched to oral clindamycin - MRI negative for osteomyelitis - ABI's - Pain control with hydrocodone-acetaminophen 5-325 mg q4h prn  HTN: 166/85. On amlodipine 10 mg, will also start hctz 25 daily.  - Continue amlodipine 10 mg  - Start HCTZ 25 mg   Dispo: Anticipated discharge pending clinical improvement and ortho recommendations. Karilyn Cota, Areeg Dorris Carnes, DO 05/06/2018, 9:50 AM Pager: [phone]

## 2018-05-06 NOTE — Consult Note (Signed)
ORTHOPAEDIC CONSULTATION  REQUESTING PHYSICIAN: Gust Rung, DO  Chief Complaint: Gangrenous skin and fracture of the left fourth toe.  HPI: Kristin Morales is a 73 y.o. female who presents with gangrenous skin status post blunt trauma to the left fourth toe.  Patient states that she sustained a traumatic avulsion laceration to the left fourth toe she went to the emergency room this was irrigated with saline surgically closed.  Patient states she has had progressive black changes as well as drainage and progressive cellulitis.  Past Medical History:  Diagnosis Date   Cellulitis and abscess of toe 04/2018   Kidney stones    Past Surgical History:  Procedure Laterality Date   APPENDECTOMY     CHOLECYSTECTOMY     HEMORRHOID SURGERY     TUBAL LIGATION     Social History   Socioeconomic History   Marital status: Widowed    Spouse name: Not on file   Number of children: Not on file   Years of education: Not on file   Highest education level: Not on file  Occupational History   Not on file  Social Needs   Financial resource strain: Not on file   Food insecurity:    Worry: Not on file    Inability: Not on file   Transportation needs:    Medical: Not on file    Non-medical: Not on file  Tobacco Use   Smoking status: Former Smoker    Types: Cigarettes    Last attempt to quit: 05/02/2018    Years since quitting: 0.0   Smokeless tobacco: Never Used  Substance and Sexual Activity   Alcohol use: Never    Frequency: Never   Drug use: Never   Sexual activity: Not on file  Lifestyle   Physical activity:    Days per week: Not on file    Minutes per session: Not on file   Stress: Not on file  Relationships   Social connections:    Talks on phone: Not on file    Gets together: Not on file    Attends religious service: Not on file    Active member of club or organization: Not on file    Attends meetings of clubs or organizations: Not on file   Relationship status: Not on file  Other Topics Concern   Not on file  Social History Narrative   Not on file   History reviewed. No pertinent family history. - negative except otherwise stated in the family history section No Known Allergies Prior to Admission medications   Medication Sig Start Date End Date Taking? Authorizing Provider  acetaminophen (TYLENOL) 325 MG tablet Take 650 mg by mouth every 6 (six) hours as needed for headache.   Yes [provider]   Mr Foot Left W Wo Contrast  Result Date: 05/06/2018 CLINICAL DATA:  The patient suffered a laceration and fracture of the distal phalanx of the left fourth toe fracture when she dropped a box on her foot 04/15/2018. Subsequent encounter. EXAM: MRI OF THE LEFT FOOT WITHOUT AND WITH CONTRAST TECHNIQUE: Multiplanar, multisequence MR imaging of the left foot was performed both before and after administration of intravenous contrast. CONTRAST:  6 cc Gadavist IV. COMPARISON:  Plain films left fourth toe 04/15/2018. Plain films left foot 05/04/2018. FINDINGS: Bones/Joint/Cartilage There is marrow edema in the distal phalanx of the fourth toe. Loss of fat saturation results in artifactually increased T2 signal in the distal phalanx of the great toe and in  the second through fifth toes on some sequences. Bone marrow signal is otherwise unremarkable. Ligaments Intact. Muscles and Tendons Intact.  No intramuscular fluid collection. Soft tissues No abscess. There is some soft/there is some edema and enhancement in the soft tissues about the distal phalanx of the little toe. Mild subcutaneous edema over the dorsum of the foot is noted. IMPRESSION: Mild marrow edema and enhancement in the distal phalanx of the fourth toe are consistent with changes related to fracture. Coexistent infection can not be excluded. Negative for abscess or septic joint. Electronically Signed   By: Drusilla Kanner M.D.   On: 05/06/2018 09:20   Vas [place] Vanice Sarah With/wo  Tbi  Result Date: 05/06/2018 LOWER EXTREMITY DOPPLER STUDY Indications: Ulceration. left fourth toe non healing wound High Risk Factors: Current smoker.  Performing Technologist: Milta Deiters, [place] RVS  Examination Guidelines: A complete evaluation includes at minimum, Doppler waveform signals and systolic blood pressure reading at the level of bilateral brachial, anterior tibial, and posterior tibial arteries, when vessel segments are accessible. Bilateral testing is considered an integral part of a complete examination. Photoelectric Plethysmograph (PPG) waveforms and toe systolic pressure readings are included as required and additional duplex testing as needed. Limited examinations for reoccurring indications may be performed as noted.  ABI Findings: +--------+------------------+-----+---------+----------------------------------+  Right    Rt Pressure (mmHg) Index Waveform  Comment                             +--------+------------------+-----+---------+----------------------------------+  Brachial                          triphasic Unable to obtain pressure due to                                                 IV placement                        +--------+------------------+-----+---------+----------------------------------+  PTA      184                1.03  triphasic                                     +--------+------------------+-----+---------+----------------------------------+  DP       200                1.12  triphasic                                     +--------+------------------+-----+---------+----------------------------------+ +--------+------------------+-----+---------+-------+  Left     Lt Pressure (mmHg) Index Waveform  Comment  +--------+------------------+-----+---------+-------+  Brachial 178                      triphasic          +--------+------------------+-----+---------+-------+  PTA      218                1.22  triphasic           +--------+------------------+-----+---------+-------+  DP       190  1.07  triphasic          +--------+------------------+-----+---------+-------+  Summary: Right: Resting right ankle-brachial index is within normal range. No evidence of significant right lower extremity arterial disease. Left: Resting left ankle-brachial index is within normal range. No evidence of significant left lower extremity arterial disease.  *See table(s) above for measurements and observations.    Preliminary    - pertinent xrays, CT, MRI studies were reviewed and independently interpreted  Positive ROS: All other systems have been reviewed and were otherwise negative with the exception of those mentioned in the HPI and as above.  Physical Exam: General: Alert, no acute distress Psychiatric: Patient is competent for consent with normal mood and affect Lymphatic: No axillary or cervical lymphadenopathy Cardiovascular: No pedal edema Respiratory: No cyanosis, no use of accessory musculature GI: No organomegaly, abdomen is soft and non-tender    Images:  @  Labs:  Lab Results  Component Value Date   HGBA1C 5.5 05/06/2018   ESRSEDRATE 39 (H) 05/04/2018   CRP <0.8 05/04/2018   REPTSTATUS 11/03/2017 FINAL 11/02/2017   CULT (A) 11/02/2017    <10,000 COLONIES/mL INSIGNIFICANT GROWTH Performed at [facility], [address], [place], [place] [address]     No results found for: ALBUMIN, PREALBUMIN, LABURIC  Neurologic: Patient does not have protective sensation bilateral lower extremities.   MUSCULOSKELETAL:   Skin: Examination patient has a strong dorsalis pedis and posterior tibial pulse.  Ankle-brachial indices shows excellent triphasic circulation.  Patient has some mild redness and swelling of the fourth toe but there is no red streaking going up the foot.  There is no purulent drainage.  Patient has an ischemic wound over the medial aspect of the fourth toe with a black  eschar where the skin avulsion flap was.  Radiographs shows a fracture of the fourth toe.  Assessment: Assessment: Traumatic avulsion of the skin of the left fourth toe with a fracture.  Plan: Plan: Would have her continue with dry dressing changes would discharge on oral antibiotics for 2 weeks.  I will follow-up in the office in 1 week.  Discussed with the patient that with her good circulation she most likely will be able to epithelialize the fourth toe beneath the black eschar.  Discussed that if she has full-thickness tissue injury at follow-up and there is exposed bone or tendon we would need to consider proceeding with amputation of the fourth toe.  I will follow-up as an outpatient.  Thank you for the consult and the opportunity to see Ms. Sharyon Medicus, MD [facility] [phone] 4:32 PM

## 2018-05-06 NOTE — Progress Notes (Signed)
Internal Medicine Attending:   I saw and examined the patient. I reviewed the resident's note and I agree with the resident's findings and plan as documented in the resident's note. Swelling and erythema continue to progress blood pressure is better controlled remains afebrile.  MRI reviewed no abscess or obvious osteomyelitis.  Vascular studies pending.  We will plan to change to oral clindamycin today.  Dr. Lajoyce Corners to evaluate as well for now we will plan on likely continuing an oral antibiotic course and possible discharge tomorrow.

## 2018-05-06 NOTE — Progress Notes (Signed)
Bilateral ABIs completeted. Preliminary results in Chart review CV Proc. [place] Aaran Enberg,RVS 05/06/2018, 1:54 PM.

## 2018-05-06 NOTE — Progress Notes (Signed)
Orthopaedic Trauma Service Follow up Note  Studies being completed today HgbA1c 5.5%  Discussed case with Dr. Lajoyce Corners He will evaluate patient as well   Continue with current care   Mearl Latin, PA-C [phone] (C) 05/06/2018, 10:15 AM  [facility] [address] [place] [address] [phone] Collier Bullock (F)

## 2018-05-07 MED ORDER — AMLODIPINE BESYLATE 10 MG PO TABS: 10.0000 mg | ORAL_TABLET | Freq: Every day | ORAL | 0 refills | Status: AC

## 2018-05-07 MED ORDER — CLINDAMYCIN HCL 300 MG PO CAPS: 600.0000 mg | ORAL_CAPSULE | Freq: Three times a day (TID) | ORAL | 0 refills | Status: AC

## 2018-05-07 MED ORDER — HYDROCHLOROTHIAZIDE 25 MG PO TABS: 25.0000 mg | ORAL_TABLET | Freq: Every day | ORAL | 0 refills | Status: DC

## 2018-05-07 MED FILL — CLINDAMYCIN HCL 300 MG CAP: 300 | 11 days supply | Qty: 66 | Fill #0

## 2018-05-07 MED FILL — HYDROCHLOROTHIAZIDE 25 MG T: 25 | 30 days supply | Qty: 30 | Fill #0

## 2018-05-07 MED FILL — AMLODIPINE BESYLATE 10 MG T: 10 | 30 days supply | Qty: 30 | Fill #0

## 2018-05-07 NOTE — Progress Notes (Signed)
   Subjective: No overnight events. Patient reports feeling well this morning. Denies any pain in her left toe. Noticed swelling has improved. All questions and concerns addressed.   Objective:  Vital signs in last 24 hours: Vitals:   05/06/18 1013 05/06/18 1325 05/06/18 2026 05/07/18 0424  BP: (!) 183/92 (!) 153/77 124/65 139/75  Pulse: 85 75 73 60  Resp:  16 15 16   Temp:  98.5 F (36.9 C) 98.4 F (36.9 C) 97.6 F (36.4 C)  TempSrc:  Oral Oral Oral  SpO2:  92% 94% 96%  Weight:      Height:       Physical Exam Gen: seen comfortably resting in bed, no distress Ext: bilateral LE edema, intact pulses, warm and well perfused, left fourth toe with ?necrotic black tissue, no drainage or pus, diminished sensation to fourth toe  Assessment/Plan:  Principal Problem:   Wound infection Active Problems:   HTN (hypertension)   Cellulitis   Gangrene of toe of left foot (HCC)  Kristin Morales is a 73 year old female with HTN presenting with a wound infection of her left fourth toe.Sustained an injury to her left fourth toe and distal tuft fracture on 3/9. Underwent an I&D with laceration repair using absorbable sutures.  Wound infection: Underwent an I&D with laceration repair using absorbable sutures on 3/9. Ortho recommends continuing oral antibiotics for two weeks and follow up outpatient in one week. - Orthopedics consulted, appreciate recommendations - Continue oral clindamycin - Pain control with hydrocodone-acetaminophen 5-325 mg q4h prn  HTN:139/75.  - Continue amlodipine 10 mgand HCTZ 25 mg    Dispo: Patient is medically stable for discharge today.  Jaci Standard, DO 05/07/2018, 7:43 AM Pager: [phone]

## 2018-05-07 NOTE — Discharge Summary (Signed)
Name: Kristin Morales MRN: [id number] DOB: 05-03-1945 73 y.o. PCP: Patient, No Pcp Per  Date of Admission: 05/04/2018 11:19 AM Date of Discharge: 05/07/2018 Attending Physician: Carlynn Purl DO  Discharge Diagnosis: 1. Wound infection 2. Cellulitis  3. HTN urgency    Discharge Medications: Allergies as of 05/07/2018   No Known Allergies     Medication List    TAKE these medications   acetaminophen 325 MG tablet Commonly known as:  TYLENOL Take 650 mg by mouth every 6 (six) hours as needed for headache.   amLODipine 10 MG tablet Commonly known as:  NORVASC Take 1 tablet (10 mg total) by mouth daily. Start taking on:  May 08, 2018   clindamycin 300 MG capsule Commonly known as:  CLEOCIN Take 2 capsules (600 mg total) by mouth every 8 (eight) hours for 11 days.   hydrochlorothiazide 25 MG tablet Commonly known as:  HYDRODIURIL Take 1 tablet (25 mg total) by mouth daily. Start taking on:  May 08, 2018       Disposition and follow-up:   Kristin Morales was discharged from [facility] in Stable condition.  At the hospital follow up visit please address:  1.  HTN- patient was started on amlodipine and hctz, please assess compliance and adjust regimen as necessary   2.  Labs / imaging needed at time of follow-up: none  3.  Pending labs/ test needing follow-up: none  Follow-up Appointments: Follow-up Information    Nadara Mustard, MD Follow up in 1 week(s).   Specialty:  Orthopedic Surgery Contact information: [address] [place] [address] [phone]           Hospital Course by problem list: 1. Wound infection- Patient presented with a wound infection of her left fourth toe.Sustained an injury to her left fourth toe and distal tuft fracture on 3/9. Underwent an I&D with laceration repair using absorbable sutures on 3/9. She was afebrile with no leukocytosis.  Orthopedics saw and evaluated the patient and there is no  acute indication for amputation at this time.  They recommended IV clindamycin.  MRI is negative for osteomyelitis.  ABIs within normal range bilaterally with no evidence of significant right or left lower extremity arterial disease.  Orthopedics recommended continue oral antibiotics for total of 2 weeks and will follow-up outpatient in 1 week.  Discharged to continue on oral clindamycin. 2. Cellulitis -foot x-ray was consistent with cellulitis.  See above. 3. HTN urgency- patient presented with systolic blood pressure greater than 220.  Denies any history of hypertension and never has taken any antihypertensive medications.  She was started on amlodipine 10 mg and hydrochlorothiazide 25 mg.  Blood pressures improved, day of discharge 139/75.  She will need close follow-up with PCP.  Discharge Vitals:   BP (!) 140/93 (BP Location: Right Arm)    Pulse 89    Temp 97.6 F (36.4 C) (Oral)    Resp 16    Ht 5' 5.98" (1.676 m)    Wt 70 kg    SpO2 96%    BMI 24.92 kg/m   Pertinent Labs, Studies, and Procedures:   CBC Latest Ref Rng & Units 05/06/2018 05/05/2018 05/04/2018  WBC 4.0 - 10.5 K/uL 7.0 7.0 7.9  Hemoglobin 12.0 - 15.0 g/dL 15.7 26.2 03.5  Hematocrit 36.0 - 46.0 % 38.4 38.6 43.2  Platelets 150 - 400 K/uL 229 218 242   CMP Latest Ref Rng & Units 05/05/2018 05/04/2018 11/01/2017  Glucose 70 -  99 mg/dL 811(B) 147(W) 90  BUN 8 - 23 mg/dL Creatinine 0.44 - 1.00 mg/dL 2.95 6.21 3.08  Sodium 135 - 145 mmol/L 138 136 139  Potassium 3.5 - 5.1 mmol/L 3.8 4.1 4.0  Chloride 98 - 111 mmol/L 103 102 104  CO2 22 - 32 mmol/L Calcium 8.9 - 10.3 mg/dL 9.2 9.3 9.1   Mr Foot Left W Wo Contrast  Result Date: 05/06/2018 CLINICAL DATA:  The patient suffered a laceration and fracture of the distal phalanx of the left fourth toe fracture when she dropped a box on her foot 04/15/2018. Subsequent encounter. EXAM: MRI OF THE LEFT FOOT WITHOUT AND WITH CONTRAST TECHNIQUE: Multiplanar, multisequence MR  imaging of the left foot was performed both before and after administration of intravenous contrast. CONTRAST:  6 cc Gadavist IV. COMPARISON:  Plain films left fourth toe 04/15/2018. Plain films left foot 05/04/2018. FINDINGS: Bones/Joint/Cartilage There is marrow edema in the distal phalanx of the fourth toe. Loss of fat saturation results in artifactually increased T2 signal in the distal phalanx of the great toe and in the second through fifth toes on some sequences. Bone marrow signal is otherwise unremarkable. Ligaments Intact. Muscles and Tendons Intact.  No intramuscular fluid collection. Soft tissues No abscess. There is some soft/there is some edema and enhancement in the soft tissues about the distal phalanx of the little toe. Mild subcutaneous edema over the dorsum of the foot is noted. IMPRESSION: Mild marrow edema and enhancement in the distal phalanx of the fourth toe are consistent with changes related to fracture. Coexistent infection can not be excluded. Negative for abscess or septic joint. Electronically Signed   By: Drusilla Kanner M.D.   On: 05/06/2018 09:20   Dg Foot Complete Left  Result Date: 05/04/2018 CLINICAL DATA:  Bleeding and leaking fluids about the fourth toe. Prior fracture. EXAM: LEFT FOOT - COMPLETE 3+ VIEW COMPARISON:  Toe radiographs of 04/15/2018 FINDINGS: Overlap of digits on the lateral view. Moderate dorsal soft tissue swelling about the midfoot. Redemonstration of fourth phalangeal tuft fracture, suboptimally evaluated. No soft tissue gas. Mild soft tissue swelling about the fourth phalanx remains. IMPRESSION: Soft tissue swelling and suboptimally visualized fourth phalangeal tuft fracture. More diffuse soft tissue swelling about the forefoot and midfoot dorsally. Electronically Signed   By: Jeronimo Greaves M.D.   On: 05/04/2018 12:31   Vas [place] Vanice Sarah With/wo Tbi  Result Date: 05/06/2018 LOWER EXTREMITY DOPPLER STUDY Indications: Ulceration. left fourth toe non healing  wound High Risk Factors: Current smoker.  Performing Technologist: Milta Deiters, [place] [employer]  Examination Guidelines: A complete evaluation includes at minimum, Doppler waveform signals and systolic blood pressure reading at the level of bilateral brachial, anterior tibial, and posterior tibial arteries, when vessel segments are accessible. Bilateral testing is considered an integral part of a complete examination. Photoelectric Plethysmograph (PPG) waveforms and toe systolic pressure readings are included as required and additional duplex testing as needed. Limited examinations for reoccurring indications may be performed as noted.  ABI Findings: +--------+------------------+-----+---------+----------------------------------+  Right    Rt Pressure (mmHg) Index Waveform  Comment                             +--------+------------------+-----+---------+----------------------------------+  Brachial                          triphasic Unable to obtain pressure due to  IV placement                        +--------+------------------+-----+---------+----------------------------------+  PTA      184                1.03  triphasic                                     +--------+------------------+-----+---------+----------------------------------+  DP       200                1.12  triphasic                                     +--------+------------------+-----+---------+----------------------------------+ +--------+------------------+-----+---------+-------+  Left     Lt Pressure (mmHg) Index Waveform  Comment  +--------+------------------+-----+---------+-------+  Brachial 178                      triphasic          +--------+------------------+-----+---------+-------+  PTA      218                1.22  triphasic          +--------+------------------+-----+---------+-------+  DP       190                1.07  triphasic          +--------+------------------+-----+---------+-------+   Summary: Right: Resting right ankle-brachial index is within normal range. No evidence of significant right lower extremity arterial disease. Left: Resting left ankle-brachial index is within normal range. No evidence of significant left lower extremity arterial disease.  *See table(s) above for measurements and observations.  Electronically signed by Fabienne Bruns MD on 05/06/2018 at 8:48:33 PM.   Final     Discharge Instructions: Discharge Instructions    Diet - low sodium heart healthy   Complete by:  As directed    Discharge instructions   Complete by:  As directed    Ms. Pezza,  You were hospitalized due to your left fourth toe wound infection. You were treated with IV antibiotics and transitioned to oral antibiotics. I want you to continue taking clindamycin for 11 more days. I want you to take 600 mg (2 tablets) every 8 hours. You were given one dose this morning so take another dose at 2 pm and then again at 10 pm.   You were also found to have very high blood pressure. I started you on amlodipine 10 mg and hydrochlorothiazide 25 mg. Continue to take these medications once a day.  Please follow up with your PCP and Orthopedics in one week.  Thank you for allowing [place] to be a part of your care!   Increase activity slowly   Complete by:  As directed       Signed: Delenn Ahn N, DO 05/07/2018, 1:16 PM

## 2018-05-07 NOTE — Discharge Instructions (Signed)
Please start taking your blood pressure medications (amlodipine and hydrochlorothiazide) tomorrow

## 2018-05-07 NOTE — Progress Notes (Signed)
Pt given discharge instructions and gone over with her. All questions answered. Pt waiting on medications from pharmacy. All belongings gathered to be sent home.

## 2018-05-13 ENCOUNTER — Telehealth (INDEPENDENT_AMBULATORY_CARE_PROVIDER_SITE_OTHER): Payer: Self-pay

## 2018-05-13 NOTE — Telephone Encounter (Signed)
Called pt and she answered NO to all the COVID-19 prescreen questions. Her daughter has an appt here tomorrow at 10:45 and asked if her appt could be moved up. I advised that this will be fine. I will not change it on the sch but we can see her early while her daughter is being seen as well.

## 2018-05-14 ENCOUNTER — Ambulatory Visit (INDEPENDENT_AMBULATORY_CARE_PROVIDER_SITE_OTHER): Payer: Self-pay | Admitting: Orthopedic Surgery

## 2018-05-14 ENCOUNTER — Other Ambulatory Visit: Payer: Self-pay

## 2018-05-14 ENCOUNTER — Encounter (INDEPENDENT_AMBULATORY_CARE_PROVIDER_SITE_OTHER): Payer: Self-pay | Admitting: Orthopedic Surgery

## 2018-05-14 VITALS — Ht 65.98 in | Wt 154.3 lb

## 2018-05-14 DIAGNOSIS — I96 Gangrene, not elsewhere classified: Secondary | ICD-10-CM

## 2018-05-14 NOTE — Progress Notes (Signed)
Office Visit Note   Patient: Kristin BosHelen Morales           Date of Birth: [birth year]           MRN: [id number] Visit Date: 05/14/2018              Requested by: No referring provider defined for this encounter. PCP: Patient, No Pcp Per  Chief Complaint  Patient presents with  . Left Foot - Pain    NP; left 4th toe , [facility]      HPI: Patient is a 73 year old woman who was seen for infection and necrosis left foot fourth toe.  Patient states initial injury is that she tripped she sustained a large evulsion laceration to the toe sustaining a fracture.  She initially underwent closure of the wound was [facility] scan was obtained.  Patient states that she still has swelling in her foot and increased odor.  Assessment & Plan: Visit Diagnoses:  1. Gangrene of toe of left foot (HCC)     Plan: We will continue with conservative therapy patient has good pulses the ischemic wound has healthy granulation tissue around the wound edges and we will see if this can heal discussed that if this wound cannot heal continues to show gangrenous changes we would need to proceed with a fourth toe amputation.  Follow-Up Instructions: Return in about 1 week (around 05/21/2018).   Ortho Exam  Patient is alert, oriented, no adenopathy, well-dressed, normal affect, normal respiratory effort. Examination patient has strong triphasic dorsalis pedis and posterior tibial pulse the Doppler was used.  On the medial border of the left fourth toe there is a gangrenous eschar the wound edges are healthy viable there is no cellulitis the toe is straight.  Patient has been on clindamycin.  No clinical signs of infection no sausage digit swelling.  Imaging: No results found. No images are attached to the encounter.  Labs: Lab Results  Component Value Date   HGBA1C 5.5 05/06/2018   ESRSEDRATE 39 (H) 05/04/2018   CRP <0.8 05/04/2018   REPTSTATUS 11/03/2017 FINAL 11/02/2017    CULT (A) 11/02/2017    <10,000 COLONIES/mL INSIGNIFICANT GROWTH Performed at [facility], [address] [place], [place] [id number]      No results found for: ALBUMIN, PREALBUMIN, LABURIC  Body mass index is 24.92 kg/m.  Orders:  No orders of the defined types were placed in this encounter.  No orders of the defined types were placed in this encounter.    Procedures: No procedures performed  Clinical Data: No additional findings.  ROS:  All other systems negative, except as noted in the HPI. Review of Systems  Objective: Vital Signs: Ht 5' 5.98" (1.676 m)   Wt 154 lb 5.1 oz (70 kg)   BMI 24.92 kg/m   Specialty Comments:  No specialty comments available.  PMFS History: Patient Active Problem List   Diagnosis Date Noted  . Cellulitis   . Gangrene of toe of left foot (HCC)   . HTN (hypertension) 05/04/2018  . Wound infection 05/04/2018   Past Medical History:  Diagnosis Date  . Cellulitis and abscess of toe 04/2018  . Kidney stones     History reviewed. No pertinent family history.  Past Surgical History:  Procedure Laterality Date  . APPENDECTOMY    . CHOLECYSTECTOMY    . HEMORRHOID SURGERY    . TUBAL LIGATION     Social History  Occupational History  . Not on file  Tobacco Use  . Smoking status: Former Smoker    Types: Cigarettes    Last attempt to quit: 05/02/2018    Years since quitting: 0.0  . Smokeless tobacco: Never Used  Substance and Sexual Activity  . Alcohol use: Never    Frequency: Never  . Drug use: Never  . Sexual activity: Not on file

## 2018-05-15 LAB — NICOTINE/COTININE METABOLITES
COTININE: 21.7 ng/mL
Nicotine: <1 ng/mL

## 2018-05-21 ENCOUNTER — Ambulatory Visit (INDEPENDENT_AMBULATORY_CARE_PROVIDER_SITE_OTHER): Payer: Self-pay | Admitting: Orthopedic Surgery

## 2018-05-21 ENCOUNTER — Other Ambulatory Visit: Payer: Self-pay

## 2018-05-21 ENCOUNTER — Encounter (INDEPENDENT_AMBULATORY_CARE_PROVIDER_SITE_OTHER): Payer: Self-pay | Admitting: Orthopedic Surgery

## 2018-05-21 VITALS — Ht 65.98 in | Wt 154.3 lb

## 2018-05-21 DIAGNOSIS — I96 Gangrene, not elsewhere classified: Secondary | ICD-10-CM

## 2018-05-21 DIAGNOSIS — I10 Essential (primary) hypertension: Secondary | ICD-10-CM

## 2018-05-21 DIAGNOSIS — F17200 Nicotine dependence, unspecified, uncomplicated: Secondary | ICD-10-CM

## 2018-05-21 NOTE — Progress Notes (Signed)
Office Visit Note   Patient: Kristin Morales           Date of Birth: [birth year]           MRN: [id number] Visit Date: 05/21/2018              Requested by: No referring provider defined for this encounter. PCP: Patient, No Pcp Per  Chief Complaint  Patient presents with  . Left Foot - Follow-up      HPI: The patient is a 73 year old woman who is seen for follow-up of her left fourth toe.  She sustained a injury to her left fourth toe around 04/15/2018 when something fell out of a box and lacerated her fourth toe.  She presented to the emergency room for evaluation following the injury and an I&D was performed and will she had a distal tuft fracture noted along with a laceration.  She underwent closure with Vicryl suture. She was then admitted to the hospital 05/04/2018 through 05/07/2018, with worsening erythema, drainage and swelling of the left foot with concerns that she had cellulitis.  She was treated with IV clindamycin while she was hospitalized and then switched to p.o. clindamycin after discharge.  She presents for follow-up of the wound.  She reports that she is continued to have improvement in the swelling and less erythema and less odor but continues to have some residual tan to bloody drainage.  She reports she completed her oral antibiotics, clindamycin on 05/18/2018.  She reports she has residual swelling in the left leg and is on a diuretic pill. She had an MRI scan while hospitalized which showed her distal phalanx fracture with some localized edema and enhancement related to this.  There is no evidence for abscess or septic joint or osteomyelitis at this point. She does smoke cigarettes and has been smoking about 3 to 4 cigarettes daily.  Assessment & Plan: Visit Diagnoses:  1. Gangrene of toe of left foot (HCC)   2. Essential hypertension   3. Nicotine dependence with current use     Plan: We will place her in a Vive silver compression stocking today she was given an  extra-large here in the office, and then a prescription to pick up 1 and large size.  We recommend that she wear this around the clock except for showering.  She should wash the area with Dial soap and water daily.  Continue to monitor the drainage.  Were not going to resume any antibiotics at this time but will have her follow-up next week for recheck.  Follow-Up Instructions: Return in about 1 week (around 05/28/2018).   Ortho Exam  Patient is alert, oriented, no adenopathy, well-dressed, normal affect, normal respiratory effort. The left 4th toe tuft and distal toe have gangrenous black eschar present, small medial ulceration over the base of the toe medially with scant tan drainage. There is some tenderness to palpation. There is edema of the left foot and calf. No sausage digit swelling.  Calf is 34 cm with minimal pitting. Good palpable pedal pulses.   Imaging: No results found.   Labs: Lab Results  Component Value Date   HGBA1C 5.5 05/06/2018   ESRSEDRATE 39 (H) 05/04/2018   CRP <0.8 05/04/2018   REPTSTATUS 11/03/2017 FINAL 11/02/2017   CULT (A) 11/02/2017    <10,000 COLONIES/mL INSIGNIFICANT GROWTH Performed at [facility], [address], [place], [place] [address]      No results found for: ALBUMIN, PREALBUMIN, LABURIC  Body mass  index is 24.92 kg/m.  Orders:  No orders of the defined types were placed in this encounter.  No orders of the defined types were placed in this encounter.    Procedures: No procedures performed  Clinical Data: No additional findings.  ROS:  All other systems negative, except as noted in the HPI. Review of Systems  Objective: Vital Signs: Ht 5' 5.98" (1.676 m)   Wt 154 lb 5.1 oz (70 kg)   BMI 24.92 kg/m   Specialty Comments:  No specialty comments available.  PMFS History: Patient Active Problem List   Diagnosis Date Noted  . Cellulitis   . Gangrene of toe of left foot (HCC)   . HTN (hypertension) 05/04/2018  .  Wound infection 05/04/2018   Past Medical History:  Diagnosis Date  . Cellulitis and abscess of toe 04/2018  . Kidney stones     History reviewed. No pertinent family history.  Past Surgical History:  Procedure Laterality Date  . APPENDECTOMY    . CHOLECYSTECTOMY    . HEMORRHOID SURGERY    . TUBAL LIGATION     Social History   Occupational History  . Not on file  Tobacco Use  . Smoking status: Former Smoker    Types: Cigarettes    Last attempt to quit: 05/02/2018    Years since quitting: 0.0  . Smokeless tobacco: Never Used  Substance and Sexual Activity  . Alcohol use: Never    Frequency: Never  . Drug use: Never  . Sexual activity: Not on file

## 2018-05-28 ENCOUNTER — Other Ambulatory Visit: Payer: Self-pay

## 2018-05-28 ENCOUNTER — Encounter (INDEPENDENT_AMBULATORY_CARE_PROVIDER_SITE_OTHER): Payer: Self-pay | Admitting: Orthopedic Surgery

## 2018-05-28 ENCOUNTER — Ambulatory Visit (INDEPENDENT_AMBULATORY_CARE_PROVIDER_SITE_OTHER): Payer: Self-pay | Admitting: Physician Assistant

## 2018-05-28 VITALS — Ht 65.98 in | Wt 154.3 lb

## 2018-05-28 DIAGNOSIS — I10 Essential (primary) hypertension: Secondary | ICD-10-CM

## 2018-05-28 DIAGNOSIS — I96 Gangrene, not elsewhere classified: Secondary | ICD-10-CM

## 2018-05-28 DIAGNOSIS — F17200 Nicotine dependence, unspecified, uncomplicated: Secondary | ICD-10-CM

## 2018-05-28 MED ORDER — DOXYCYCLINE HYCLATE 100 MG PO CAPS: 100.0000 mg | ORAL_CAPSULE | Freq: Two times a day (BID) | ORAL | 1 refills | Status: AC

## 2018-05-28 MED ORDER — HYDROCHLOROTHIAZIDE 25 MG PO TABS: 25.0000 mg | ORAL_TABLET | Freq: Every day | ORAL | 0 refills | Status: AC

## 2018-05-28 NOTE — Progress Notes (Signed)
Office Visit Note   Patient: Kristin Morales           Date of Birth: 1945-02-13           MRN: [id number] Visit Date: 05/28/2018              Requested by: No referring provider defined for this encounter. PCP: Patient, No Pcp Per  Chief Complaint  Patient presents with  . Left Foot - Follow-up      HPI: The patient is a 73 year old woman who is seen for follow-up of her left fourth toe.  She underwent treatment in the hospital with intravenous clindamycin and was discharged home with oral clindamycin.  She had an MRI scan while hospitalized that showed a distal phalanx fracture with no evidence for abscess or osteomyelitis at this point.  She does have some continued necrosis of the soft tissue envelope of the left fourth toe.  She reports that she has been wearing her medical compression stocking as advised but is only been able to tolerate this during the day.  She reports that she feels like her swelling is worsened as she ran out of her hydrochlorothiazide.  She does not currently have a primary care physician locally and requests that we refill this until she can find a primary care physician.  She does notice some occasional drainage from the area.  She would like to continue conservative treatment if at all possible to preserve her toe.  Assessment & Plan: Visit Diagnoses:  1. Gangrene of toe of left foot (HCC)   2. Essential hypertension   3. Nicotine dependence with current use     Plan: Will start doxycycline 100 mg p.o. twice daily.  She is going to apply some Iodosorb ointment to the area once daily and then continue to utilize her medical compression sock.  It was recommended that she try to wear the medical compression sock around-the-clock except for showering if at all possible.  Her hydrochlorothiazide was refilled x1 and it was recommended that she continue to work towards getting a primary care physician as soon as she is able.  She will follow-up in 1 week for  recheck.  Follow-Up Instructions: Return in about 1 week (around 06/04/2018).   Ortho Exam  Patient is alert, oriented, no adenopathy, well-dressed, normal affect, normal respiratory effort. Left fourth toe tuft and distal toe have continued gangrenous black eschar present and there is a small ulceration over the base of the toe medially with some scant serous appearing drainage.  There is tenderness to palpation.  There is odor from the area but no sausage digit swelling currently.  The calf is approximately 32 cm today and without pitting.  She has good palpable pedal pulses.  Imaging: No results found.   Labs: Lab Results  Component Value Date   HGBA1C 5.5 05/06/2018   ESRSEDRATE 39 (H) 05/04/2018   CRP <0.8 05/04/2018   REPTSTATUS 11/03/2017 FINAL 11/02/2017   CULT (A) 11/02/2017    <10,000 COLONIES/mL INSIGNIFICANT GROWTH Performed at [facility], [address], [place], [place] [address]      No results found for: ALBUMIN, PREALBUMIN, LABURIC  Body mass index is 24.92 kg/m.  Orders:  No orders of the defined types were placed in this encounter.  Meds ordered this encounter  Medications  . doxycycline (VIBRAMYCIN) 100 MG capsule    Sig: Take 1 capsule (100 mg total) by mouth 2 (two) times daily.    Dispense:  28  capsule    Refill:  1  . hydrochlorothiazide (HYDRODIURIL) 25 MG tablet    Sig: Take 1 tablet (25 mg total) by mouth daily.    Dispense:  30 tablet    Refill:  0     Procedures: No procedures performed  Clinical Data: No additional findings.  ROS:  All other systems negative, except as noted in the HPI. Review of Systems  Objective: Vital Signs: Ht 5' 5.98" (1.676 m)   Wt 154 lb 5.1 oz (70 kg)   BMI 24.92 kg/m   Specialty Comments:  No specialty comments available.  PMFS History: Patient Active Problem List   Diagnosis Date Noted  . Cellulitis   . Gangrene of toe of left foot (HCC)   . HTN (hypertension) 05/04/2018  . Wound  infection 05/04/2018   Past Medical History:  Diagnosis Date  . Cellulitis and abscess of toe 04/2018  . Kidney stones     History reviewed. No pertinent family history.  Past Surgical History:  Procedure Laterality Date  . APPENDECTOMY    . CHOLECYSTECTOMY    . HEMORRHOID SURGERY    . TUBAL LIGATION     Social History   Occupational History  . Not on file  Tobacco Use  . Smoking status: Former Smoker    Types: Cigarettes    Last attempt to quit: 05/02/2018    Years since quitting: 0.0  . Smokeless tobacco: Never Used  Substance and Sexual Activity  . Alcohol use: Never    Frequency: Never  . Drug use: Never  . Sexual activity: Not on file

## 2018-06-04 ENCOUNTER — Encounter (INDEPENDENT_AMBULATORY_CARE_PROVIDER_SITE_OTHER): Payer: Self-pay | Admitting: Orthopedic Surgery

## 2018-06-04 ENCOUNTER — Ambulatory Visit (INDEPENDENT_AMBULATORY_CARE_PROVIDER_SITE_OTHER): Payer: Self-pay | Admitting: Orthopedic Surgery

## 2018-06-04 ENCOUNTER — Other Ambulatory Visit: Payer: Self-pay

## 2018-06-04 VITALS — Ht 65.0 in | Wt 154.0 lb

## 2018-06-04 DIAGNOSIS — L97522 Non-pressure chronic ulcer of other part of left foot with fat layer exposed: Secondary | ICD-10-CM

## 2018-06-04 NOTE — Progress Notes (Signed)
Office Visit Note   Patient: Kristin Morales           Date of Birth: 1945-09-18           MRN: [id number] Visit Date: 06/04/2018              Requested by: No referring provider defined for this encounter. PCP: Patient, No Pcp Per  Chief Complaint  Patient presents with   Left Foot - Follow-up    4th toe ulcer       HPI: The patient is a 73 year old woman who is seen for follow-up of her left fourth toe ulcer.  She was hospitalized that she had signs of cellulitis and was treated with intravenous clindamycin and discharged home on oral clindamycin.  An MRI scan while hospitalized did show a distal phalanx fracture but no evidence for abscess or osteomyelitis during that time. She has been on doxycycline 100 mg p.o. twice daily and utilizing some Iodosorb ointment to the distal toe.  She does note some increased erythema and swelling of the toe over the last day.  She is weightbearing as tolerated with postop shoe and utilizing a medical compression sock.  She has noticed some bloody drainage and we discussed that this is not concerning and it of itself she does have an open wound residually. She had ABIs during her hospitalization which were normal.  Assessment & Plan: Visit Diagnoses:  1. Ulcer of left second toe, with fat layer exposed (HCC)     Plan: She will continue her course of doxycycline.  She can discontinue applying the Iodosorb and just washed and dried the toe daily and then put the medical compression sock directly in contact with skin.  She will follow-up here in 2 weeks.  Follow-Up Instructions: Return in about 2 weeks (around 06/18/2018).   Ortho Exam  Patient is alert, oriented, no adenopathy, well-dressed, normal affect, normal respiratory effort. The tuft of the toe had black necrotic tissue which was debrided from the area and there is intact skin underneath.  She has a small ulcer over the medial aspect of the left fourth toe which has some mild serous  drainage.  She does have some increased erythema and swelling of the fourth toe compared with previous visit.  She has palpable pedal pulses.  Imaging: No results found. No images are attached to the encounter.  Labs: Lab Results  Component Value Date   HGBA1C 5.5 05/06/2018   ESRSEDRATE 39 (H) 05/04/2018   CRP <0.8 05/04/2018   REPTSTATUS 11/03/2017 FINAL 11/02/2017   CULT (A) 11/02/2017    <10,000 COLONIES/mL INSIGNIFICANT GROWTH Performed at [facility], [address] [place], [place] [id number]      No results found for: ALBUMIN, PREALBUMIN, LABURIC  Body mass index is 25.63 kg/m.  Orders:  No orders of the defined types were placed in this encounter.  No orders of the defined types were placed in this encounter.    Procedures: No procedures performed  Clinical Data: No additional findings.  ROS:  All other systems negative, except as noted in the HPI. Review of Systems  Objective: Vital Signs: Ht 5\' 5"  (1.651 m)    Wt 154 lb (69.9 kg)    BMI 25.63 kg/m   Specialty Comments:  No specialty comments available.  PMFS History: Patient Active Problem List   Diagnosis Date Noted   Cellulitis    Gangrene of toe of left foot (HCC)    HTN (hypertension) 05/04/2018  Wound infection 05/04/2018   Past Medical History:  Diagnosis Date   Cellulitis and abscess of toe 04/2018   Kidney stones     History reviewed. No pertinent family history.  Past Surgical History:  Procedure Laterality Date   APPENDECTOMY     CHOLECYSTECTOMY     HEMORRHOID SURGERY     TUBAL LIGATION     Social History   Occupational History   Not on file  Tobacco Use   Smoking status: Former Smoker    Types: Cigarettes    Last attempt to quit: 05/02/2018    Years since quitting: 0.0   Smokeless tobacco: Never Used  Substance and Sexual Activity   Alcohol use: Never    Frequency: Never   Drug use: Never   Sexual activity: Not on file

## 2018-06-18 ENCOUNTER — Other Ambulatory Visit: Payer: Self-pay

## 2018-06-18 ENCOUNTER — Encounter: Payer: Self-pay | Admitting: Orthopedic Surgery

## 2018-06-18 ENCOUNTER — Ambulatory Visit (INDEPENDENT_AMBULATORY_CARE_PROVIDER_SITE_OTHER): Payer: Self-pay | Admitting: Physician Assistant

## 2018-06-18 VITALS — Ht 65.0 in | Wt 154.0 lb

## 2018-06-18 DIAGNOSIS — S92535D Nondisplaced fracture of distal phalanx of left lesser toe(s), subsequent encounter for fracture with routine healing: Secondary | ICD-10-CM

## 2018-06-18 DIAGNOSIS — L97522 Non-pressure chronic ulcer of other part of left foot with fat layer exposed: Secondary | ICD-10-CM

## 2018-06-19 ENCOUNTER — Other Ambulatory Visit (INDEPENDENT_AMBULATORY_CARE_PROVIDER_SITE_OTHER): Payer: Self-pay | Admitting: Physician Assistant

## 2018-06-19 ENCOUNTER — Encounter: Payer: Self-pay | Admitting: Physician Assistant

## 2018-06-19 NOTE — Progress Notes (Signed)
Office Visit Note   Patient: Kristin Morales           Date of Birth: 03-05-1945           MRN: [id number] Visit Date: 06/18/2018              Requested by: No referring provider defined for this encounter. PCP: Patient, No Pcp Per  Chief Complaint  Patient presents with  . Left Foot - Follow-up      HPI: The patient is a 73 year old woman who is seen for follow-up of her left fourth toe ulcer/tuft fracture which occurred several months ago.  She was treated with oral antibiotics, doxycycline and is putting antibiotic ointment on the small residual wound.  She reports that she occasionally notes some bloody drainage.  The discomfort in the toe is much improved.  She is wearing a medical compression sock.  Assessment & Plan: Visit Diagnoses:  1. Ulcer of left second toe, with fat layer exposed (HCC)   2. Open nondisplaced fracture of distal phalanx of lesser toe of left foot with routine healing, subsequent encounter     Plan: She should continue to wash the left foot and toe daily with soap and water.  She can apply antibiotic ointments and then medical compression socks.  She will follow-up in 4 weeks.  Follow-Up Instructions: Return in about 4 weeks (around 07/16/2018).   Ortho Exam  Patient is alert, oriented, no adenopathy, well-dressed, normal affect, normal respiratory effort. She has a small area over the fourth toe medially which is slightly open and draining a small amount of serous appearing drainage.  There is no signs of infection no sausage toe.  Her tenderness to palpation is much improved but she still has some tenderness over the distal tuft area where the fracture is.  She has palpable dorsalis pedis pulse.  Imaging: No results found. No images are attached to the encounter.  Labs: Lab Results  Component Value Date   HGBA1C 5.5 05/06/2018   ESRSEDRATE 39 (H) 05/04/2018   CRP <0.8 05/04/2018   REPTSTATUS 11/03/2017 FINAL 11/02/2017   CULT (A) 11/02/2017     <10,000 COLONIES/mL INSIGNIFICANT GROWTH Performed at [facility], [address], [place], [place] [address]      No results found for: ALBUMIN, PREALBUMIN, LABURIC  Body mass index is 25.63 kg/m.  Orders:  No orders of the defined types were placed in this encounter.  No orders of the defined types were placed in this encounter.    Procedures: No procedures performed  Clinical Data: No additional findings.  ROS:  All other systems negative, except as noted in the HPI. Review of Systems  Objective: Vital Signs: Ht 5\' 5"  (1.651 m)   Wt 154 lb (69.9 kg)   BMI 25.63 kg/m   Specialty Comments:  No specialty comments available.  PMFS History: Patient Active Problem List   Diagnosis Date Noted  . Cellulitis   . Gangrene of toe of left foot (HCC)   . HTN (hypertension) 05/04/2018  . Wound infection 05/04/2018   Past Medical History:  Diagnosis Date  . Cellulitis and abscess of toe 04/2018  . Kidney stones     History reviewed. No pertinent family history.  Past Surgical History:  Procedure Laterality Date  . APPENDECTOMY    . CHOLECYSTECTOMY    . HEMORRHOID SURGERY    . TUBAL LIGATION     Social History   Occupational History  . Not on file  Tobacco Use  . Smoking status: Former Smoker    Types: Cigarettes    Last attempt to quit: 05/02/2018    Years since quitting: 0.1  . Smokeless tobacco: Never Used  Substance and Sexual Activity  . Alcohol use: Never    Frequency: Never  . Drug use: Never  . Sexual activity: Not on file

## 2018-07-23 ENCOUNTER — Ambulatory Visit: Payer: Self-pay | Admitting: Orthopedic Surgery

## 2020-01-29 IMAGING — MR MRI OF THE LEFT FOREFOOT WITHOUT AND WITH CONTRAST
4 of 9 series · 19 of 40 positions shown · IV contrast (gadavist)
Comparison: Plain films left fourth toe 04/15/2018. Plain films
left foot 05/04/2018.

CLINICAL DATA: The patient suffered a laceration and fracture of
the distal phalanx of the left fourth toe fracture when she dropped
a box on her foot 04/15/2018. Subsequent encounter.

EXAM:
MRI OF THE LEFT FOOT WITHOUT AND WITH CONTRAST
TECHNIQUE: Multiplanar, multisequence MR imaging of the left foot was performed
both before and after administration of intravenous contrast.
CONTRAST:  6 cc Gadavist IV.

[Series 6: T2 fat-sat · coronal · 3.0mm · 0.27mm/px · 7 of 65 slices shown (1 of 2)]
[im 1/65]
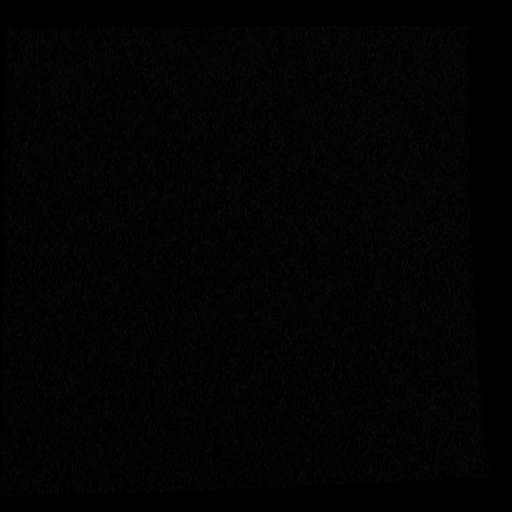
[im 11/65]
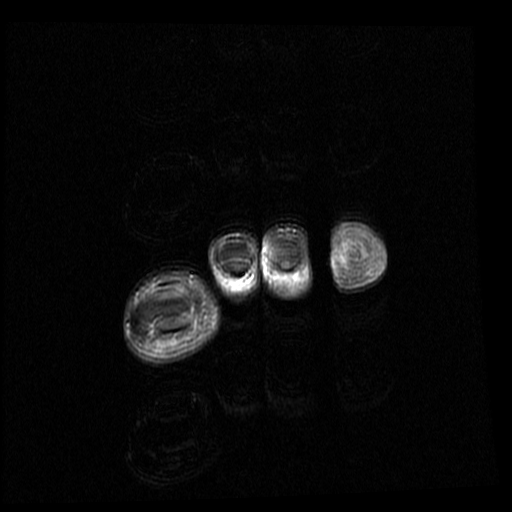
[im 22/65]
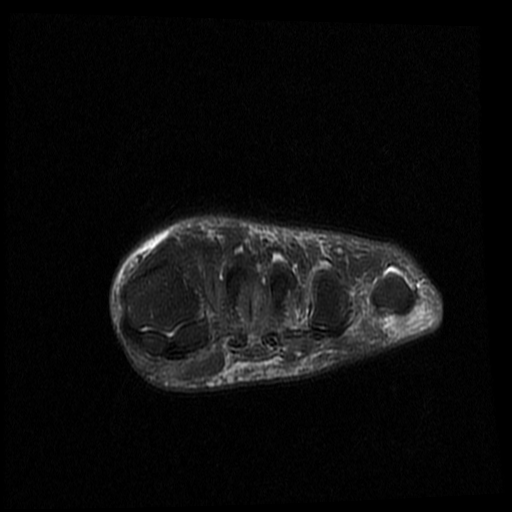
[im 33/65]
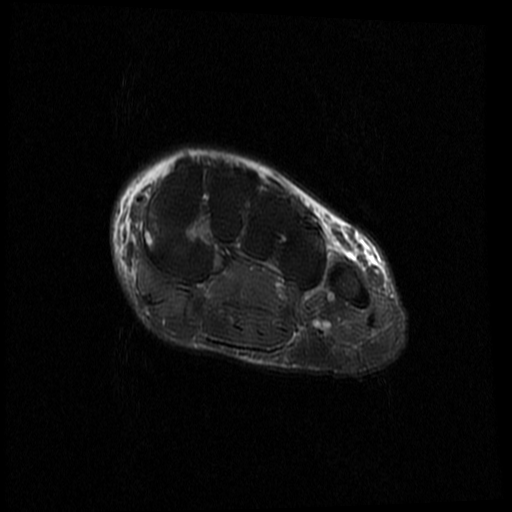
[im 43/65]
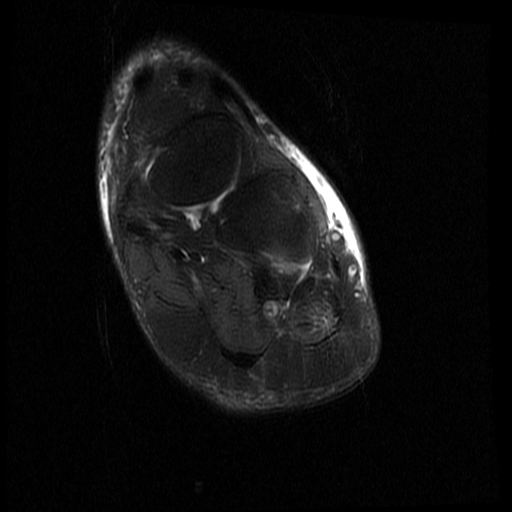
[im 54/65]
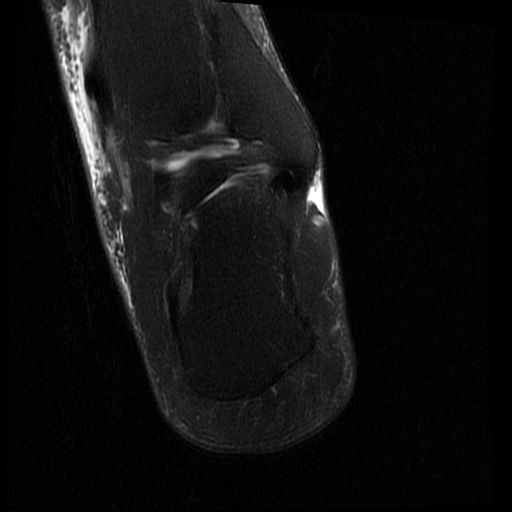
[im 65/65]
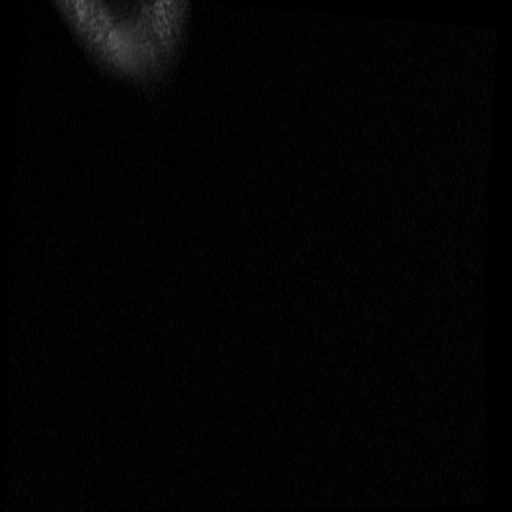

[Series 7: T1 · coronal · 3.0mm · 0.27mm/px · 6 of 65 slices shown]
[im 1/65]
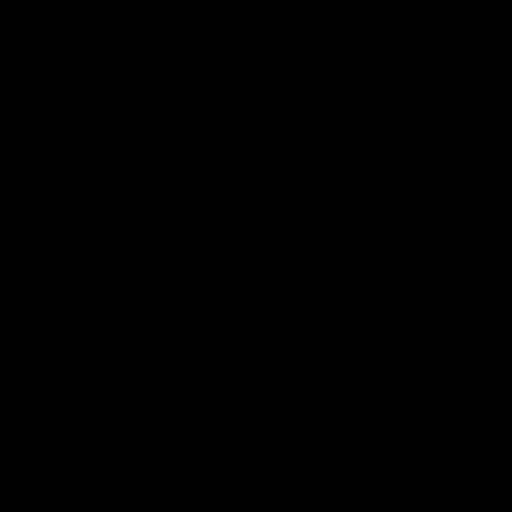
[im 13/65]
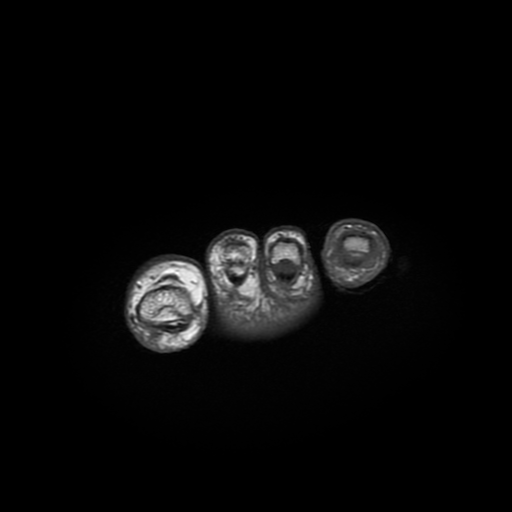
[im 26/65]
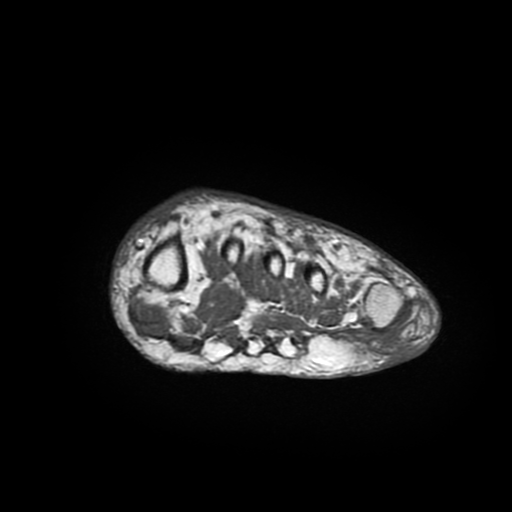
[im 39/65]
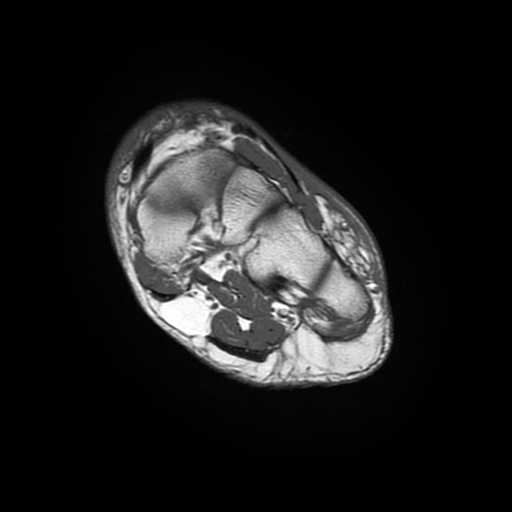
[im 52/65]
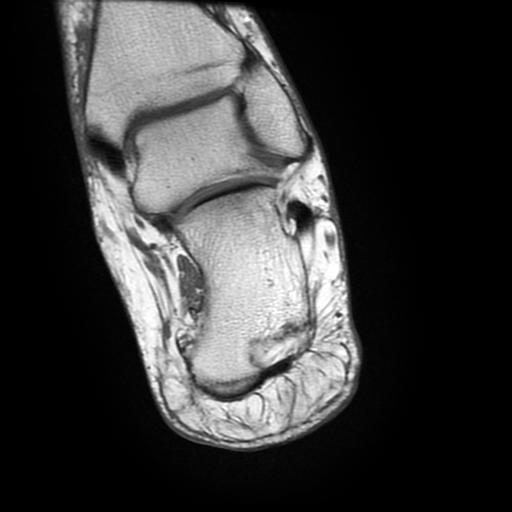
[im 65/65]
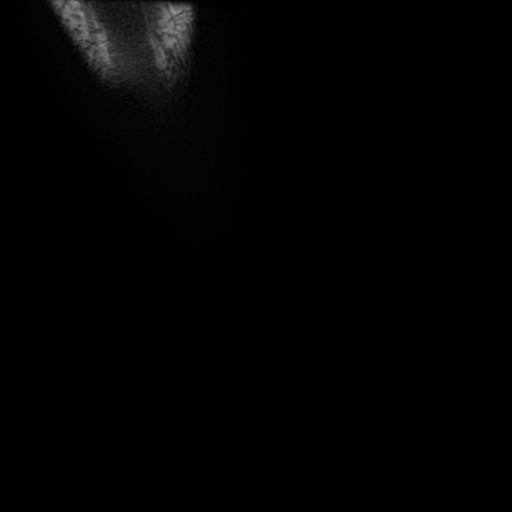

[Series 8: T1 fat-sat · coronal · non-contrast · 3.0mm · 0.27mm/px · 3 of 65 slices shown]
[im 13/65]
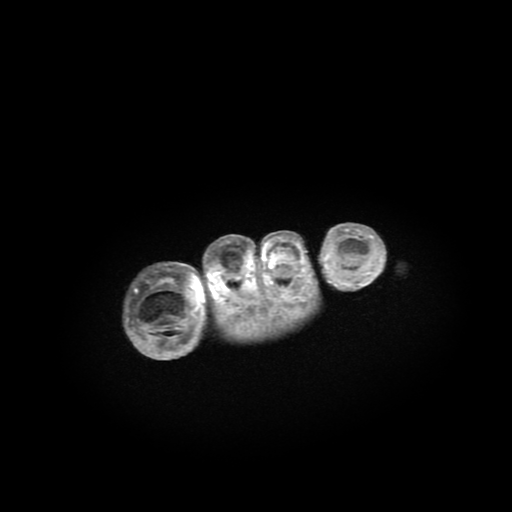
[im 39/65]
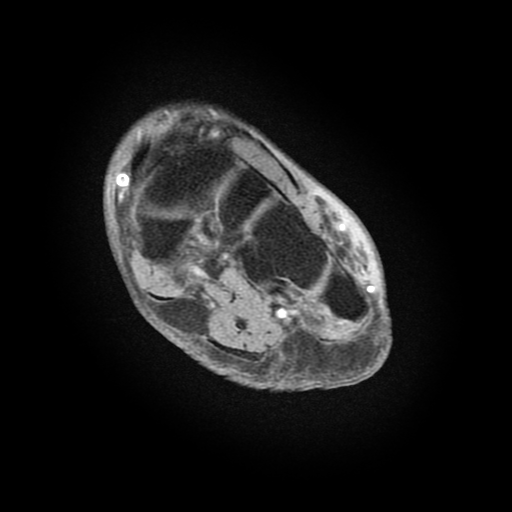
[im 65/65]
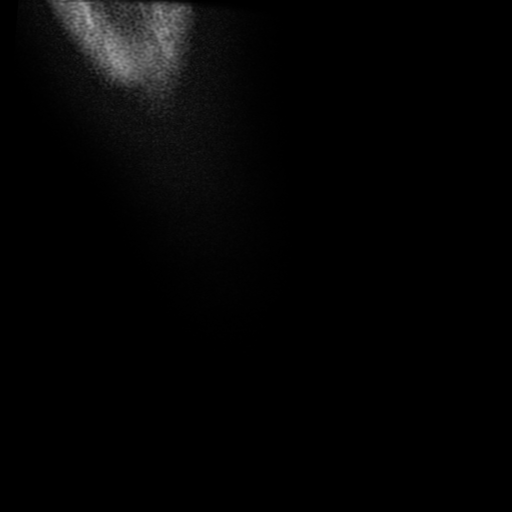

[Series 11: T2 fat-sat · axial · 3.0mm · 0.53mm/px · z∈[-15,+106]mm · 3 of 35 slices shown (2 of 2)]
[im 1/35]
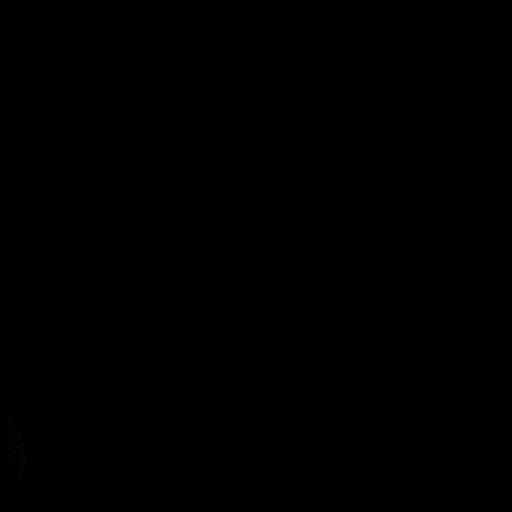
[im 18/35]
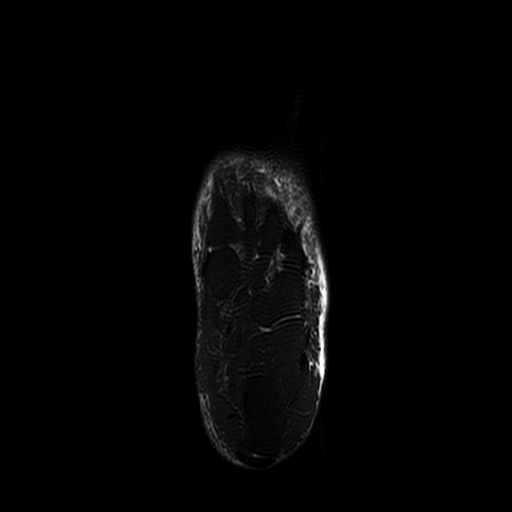
[im 35/35]
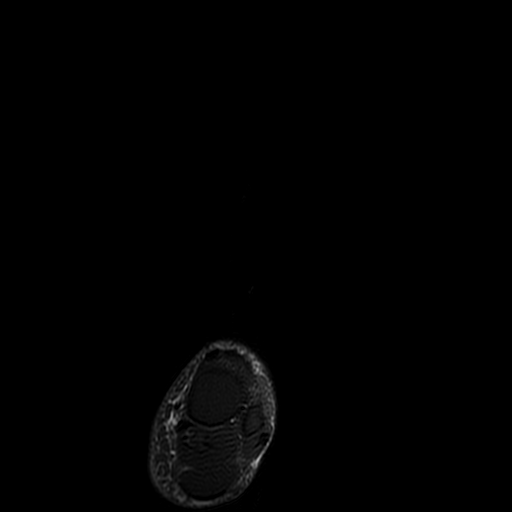

[19 of 40 positions shown; findings below may reference images not displayed]

FINDINGS: Bones/Joint/Cartilage

There is marrow edema in the distal phalanx of the fourth toe. Loss
of fat saturation results in artifactually increased T2 signal in
the distal phalanx of the great toe and in the second through fifth
toes on some sequences. Bone marrow signal is otherwise
unremarkable.

Ligaments

Intact.

Muscles and Tendons

Intact.  No intramuscular fluid collection.

Soft tissues

No abscess. There is some soft/there is some edema and enhancement
in the soft tissues about the distal phalanx of the little toe. Mild
subcutaneous edema over the dorsum of the foot is noted.
IMPRESSION: Mild marrow edema and enhancement in the distal phalanx of the
fourth toe are consistent with changes related to fracture.
Coexistent infection can not be excluded. Negative for abscess or
septic joint.
